# Patient Record
Sex: Female | Born: 1940 | Race: White | Hispanic: No | Marital: Married
Health system: Midwestern US, Community
[De-identification: ages and names within clinical notes are randomized; demographics above are authoritative.]

## PROBLEM LIST (undated history)

## (undated) DIAGNOSIS — F0390 Unspecified dementia without behavioral disturbance: Secondary | ICD-10-CM

## (undated) DIAGNOSIS — J343 Hypertrophy of nasal turbinates: Secondary | ICD-10-CM

## (undated) DIAGNOSIS — E785 Hyperlipidemia, unspecified: Secondary | ICD-10-CM

## (undated) DIAGNOSIS — M1990 Unspecified osteoarthritis, unspecified site: Secondary | ICD-10-CM

## (undated) HISTORY — PX: ABDOMINAL HYSTERECTOMY: SHX81

---

## 2011-04-26 NOTE — Op Note (Signed)
[facility]   Operation Report   NAME:  [name], [name]   SEX:   F   DATE: 04/26/2011   DOB: 10-24-1941   MR#    [id number]   ROOM:     ACCT#  [id number]       cc: Ocie Cornfield        2 copies to Dr. Lowell Guitar       Cc:  Ocie Cornfield, MD        [address]        [address], [place]  [address]       PREOPERATIVE DIAGNOSIS:   Two firm exophytic lesions anterior tip of tongue.     Lesion #1:  0.5 x 0.3 cm    Lesion #2:  0.4 x 0.3 cm.       POSTOPERATIVE DIAGNOSES:   Two firm exophytic lesions anterior tip of tongue.     Lesion #1:  0.5 x 0.3 cm    Lesion #2:  0.4 x 0.3 cm.   -- pathology pending.       PROCEDURE:   Excisional biopsy of 2 anterior tongue masses.       SURGEON:   Youlanda Roys, M.D.       ANESTHESIA:   General endotracheal anesthesia, Dr. Vassie Loll, supplemented with 1 mL of 0.5%    Marcaine with 1:200,000 epinephrine.       ESTIMATED BLOOD LOSS:   None.       DRAINS:   None.       COMPLICATIONS:   None.       INDICATIONS FOR SURGERY:   This is a pleasant 70 year old female who was referred to [facility] for a complaint of lesions on her tongue.     She was found to have 2 firm rounded lesions growing off the anterior tip of    the tongue.  One is off to the left of the midline and the other one is almost    at the midline.  They have been gradual in their growth and occurring in a    persistent pattern for 2 to 3 years.  The symptoms have been associated with    change in size and redness but no change in color, fever, pain or swelling.     Lesion is improved by nothing and it is exacerbated by nothing.  The patient's    dentist was concerned about these and he thinks it may be from biting her    tongue several times but he feels these should be biopsied and analyzed.         Physical exam confirms 2 exophytic mucosal covered lesions at the midline of    the tongue, one just off to the left of the midline and one at the midline.      We sat down with the patient, discussed options like doing them in the office    under local.  She did not want to do that; she preferred to have them done in    the hospital and be put to sleep briefly.  We have therefore scheduled her    for these excisional biopsies of these 2 tongue lesions as an outpatient under    general anesthesia.       NOTE #1:   Informed consent was obtained from the patient.       NOTE #2:   Preoperative vital signs:  Blood pressure 130/75,  pulse 59, respirations 18,    temperature 97.8, height 5 feet 7-1/2 inches tall, weight 72.8 kg.       NOTE #3:   Preoperative laboratory data:  Myocardial perfusion test done on 03/15/2011    was normal; EKG showed no acute changes.  Chest x-ray shows no scoliosis but    no acute findings.  On blood work, CBC was within normal limits.  Urinalysis    was also within normal limits, specific gravity 1.015, pH 7.0.       DESCRIPTION OF PROCEDURE:   The patient was brought to the operating room, placed in a supine position    after being identified in the holding area.  After satisfactory general    anesthesia was established via oral endotracheal intubation the patient was    given a gram of Kefzol intravenously and 10 mg of Decadron intravenously.  She    then went through the appropriate timeout period.  All parameters of the    timeout period were successfully passed according to the hospital procedure.     The patient then had a 2-0 silk suture placed through the midline of the    anterior tip of the tongue as a traction suture.  The tongue was then    protruded forward out of the mouth.  Each of the 2 lesions was identified.     Starting with the more lateral lesion on the left it was excised by taking    micro iridectomy scissors and cutting down deep into the substance of the    tongue and excising the lesion in total.  Any minor bleeding was controlled    with the needle tip cautery and then we did the same procedure on the right     side just to the right of midline on the other lesion.  Again minor bleeding    was controlled with the needle tip cautery.  The wounds were slightly    undermined and then they were closed in layers with 4-0 Monocryl using an    inverted suture to close the deep layers and invert them and then the    superficial epithelial layer was closed again with multiple interrupted 4-0    Vicryl sutures.  The wounds were then cleansed with sterile saline and dried.     No bleeding was noted.  No tongue swelling was noted and then taking a    27-gauge needle.  We took some Marcaine 0.5% with 1:200,000 epinephrine and    administered approximately 2 mL under the tongue into the anterior tip around    the lesions to provide some longterm anesthesia.  The patient is given some    postoperative instructions to have a soft diet for a week or 2, limit her    activity and no smoking and also to stay on a soft diet until she comes back    in the office in 2 weeks.  I have asked her to make an appointment to see me    in 2 weeks in the office for her checkup.           ___________________   Youlanda Roys MD   Dictated By:.    mad   D:04/26/2011   T: 04/26/2011 14:35:43   [id number]

## 2011-04-26 NOTE — Op Note (Signed)
[facility]   Operation Report   NAME:  [name] [name], [name] [name]   SEX:   F   DATE: 04/26/2011   DOB: 1941/11/19   MR#    [id number]   ROOM:     ACCT#  [id number]       cc: Ocie Cornfield        2 copies to Dr. Lowell Guitar       Cc:  Ocie Cornfield, MD        [address]        [address] [place]  [address]       PREOPERATIVE DIAGNOSIS:   Two firm exophytic lesions anterior tip of tongue.     Lesion #1:  0.5 x 0.3 cm    Lesion #2:  0.4 x 0.3 cm.       POSTOPERATIVE DIAGNOSES:   Two firm exophytic lesions anterior tip of tongue.     Lesion #1:  0.5 x 0.3 cm    Lesion #2:  0.4 x 0.3 cm.   -- pathology pending.       PROCEDURE:   Excisional biopsy of 2 anterior tongue masses.       SURGEON:   Youlanda Roys, M.D.       ANESTHESIA:   General endotracheal anesthesia, Dr. Vassie Loll, supplemented with 1 mL of 0.5%    Marcaine with 1:200,000 epinephrine.       ESTIMATED BLOOD LOSS:   None.       DRAINS:   None.       COMPLICATIONS:   None.       INDICATIONS FOR SURGERY:   This is a pleasant 70 year old female who was referred to [facility] for a complaint of lesions on her tongue.     She was found to have 2 firm rounded lesions growing off the anterior tip of    the tongue.  One is off to the left of the midline and the other one is almost    at the midline.  They have been gradual in their growth and occurring in a    persistent pattern for 2 to 3 years.  The symptoms have been associated with    change in size and redness but no change in color, fever, pain or swelling.     Lesion is improved by nothing and it is exacerbated by nothing.  The patient's    dentist was concerned about these and he thinks it may be from biting her    tongue several times but he feels these should be biopsied and analyzed.         Physical exam confirms 2 exophytic mucosal covered lesions at the midline of    the tongue, one just off to the left of the midline and one at the midline.     We sat down with the  patient, discussed options like doing them in the office    under local.  She did not want to do that; she preferred to have them done in    the hospital and be put to sleep briefly.  We have therefore scheduled her    for these excisional biopsies of these 2 tongue lesions as an outpatient under    general anesthesia.       NOTE #1:   Informed consent was obtained from the patient.       NOTE #2:   Preoperative vital signs:  Blood pressure 130/75,  pulse 59, respirations 18,    temperature 97.8, height 5 feet 7-1/2 inches tall, weight 72.8 kg.       NOTE #3:   Preoperative laboratory data:  Myocardial perfusion test done on 03/15/2011    was normal; EKG showed no acute changes.  Chest x-ray shows no scoliosis but    no acute findings.  On blood work, CBC was within normal limits.  Urinalysis    was also within normal limits, specific gravity 1.015, pH 7.0.       DESCRIPTION OF PROCEDURE:   The patient was brought to the operating room, placed in a supine position    after being identified in the holding area.  After satisfactory general    anesthesia was established via oral endotracheal intubation the patient was    given a gram of Kefzol intravenously and 10 mg of Decadron intravenously.  She    then went through the appropriate timeout period.  All parameters of the    timeout period were successfully passed according to the hospital procedure.     The patient then had a 2-0 silk suture placed through the midline of the    anterior tip of the tongue as a traction suture.  The tongue was then    protruded forward out of the mouth.  Each of the 2 lesions was identified.     Starting with the more lateral lesion on the left it was excised by taking    micro iridectomy scissors and cutting down deep into the substance of the    tongue and excising the lesion in total.  Any minor bleeding was controlled    with the needle tip cautery and then we did the same procedure on the right    side just to the right of midline  on the other lesion.  Again minor bleeding    was controlled with the needle tip cautery.  The wounds were slightly    undermined and then they were closed in layers with 4-0 Monocryl using an    inverted suture to close the deep layers and invert them and then the    superficial epithelial layer was closed again with multiple interrupted 4-0    Vicryl sutures.  The wounds were then cleansed with sterile saline and dried.     No bleeding was noted.  No tongue swelling was noted and then taking a    27-gauge needle.  We took some Marcaine 0.5% with 1:200,000 epinephrine and    administered approximately 2 mL under the tongue into the anterior tip around    the lesions to provide some longterm anesthesia.  The patient is given some    postoperative instructions to have a soft diet for a week or 2, limit her    activity and no smoking and also to stay on a soft diet until she comes back    in the office in 2 weeks.  I have asked her to make an appointment to see me    in 2 weeks in the office for her checkup.           ___________________   Youlanda Roys MD   Dictated By:.    mad   D:04/26/2011   T: 04/26/2011 14:35:43   [id number]

## 2016-07-23 ENCOUNTER — Emergency Department: Payer: Medicare Other

## 2016-07-23 ENCOUNTER — Emergency Department
Admission: EM | Admit: 2016-07-23 | Discharge: 2016-07-23 | Disposition: A | Discharge status: Home / Self Care | Payer: Medicare Other | Attending: Emergency Medicine | Admitting: Emergency Medicine

## 2016-07-23 DIAGNOSIS — Y999 Unspecified external cause status: Secondary | ICD-10-CM | POA: Insufficient documentation

## 2016-07-23 DIAGNOSIS — Y939 Activity, unspecified: Secondary | ICD-10-CM | POA: Insufficient documentation

## 2016-07-23 DIAGNOSIS — Y929 Unspecified place or not applicable: Secondary | ICD-10-CM | POA: Insufficient documentation

## 2016-07-23 DIAGNOSIS — W19XXXA Unspecified fall, initial encounter: Secondary | ICD-10-CM | POA: Diagnosis not present

## 2016-07-23 DIAGNOSIS — S82001A Unspecified fracture of right patella, initial encounter for closed fracture: Secondary | ICD-10-CM | POA: Diagnosis not present

## 2016-07-23 DIAGNOSIS — M25561 Pain in right knee: Secondary | ICD-10-CM | POA: Diagnosis present

## 2016-07-23 NOTE — Discharge Instructions (Signed)
Call the orthopedic doctor listed above. Weight-bearing as tolerated. Ice and elevation when not using.

## 2016-07-23 NOTE — ED Triage Notes (Signed)
Pt arrives to ER via POV c/o right knee pain and right elbow abrasion that occurred after mechanical fall last night. No injury to head. No LOC. Pt alert and oriented X4, active, cooperative, pt in NAD. RR even and unlabored, color WNL.  Pain worse with movement, pt able to ambulate with full ROM.

## 2016-07-23 NOTE — ED Provider Notes (Signed)
[facility] Emergency Department Provider Note  ____________________________________________  Time seen: Approximately 11:48 AM  I have reviewed the triage vital signs and the nursing notes.   HISTORY  Chief Complaint Fall and Knee Pain (right knee)    HPI Allison Ramos is a 75 y.o. female presents for evaluation of right knee and right elbow abrasion after falling last night. Patient denies any head injury or loss of consciousness. She was able to ambulate with full range of motion. She describes her pain and level of discomfort is an 8/10 at this time.   History reviewed. No pertinent past medical history.  There are no active problems to display for this patient.   History reviewed. No pertinent surgical history.  Prior to Admission medications   Not on File    Allergies Darvon [propoxyphene]  No family history on file.  Social History Social History  Substance Use Topics  . Smoking status: Never Smoker  . Smokeless tobacco: Not on file  . Alcohol use 0.6 oz/week    1 Glasses of wine per week    Review of Systems Constitutional: No fever/chills Eyes: No visual changes. ENT: No sore throat. Cardiovascular: Denies chest pain. Respiratory: Denies shortness of breath. Gastrointestinal: No abdominal pain.  No nausea, no vomiting.  No diarrhea.  No constipation. Genitourinary: Negative for dysuria. Musculoskeletal: Right knee with point tenderness and swelling noted. Positive for ecchymosis Skin: Negative for rash. Neurological: Negative for headaches, focal weakness or numbness.  10-point ROS otherwise negative.  ____________________________________________   PHYSICAL EXAM:  VITAL SIGNS: ED Triage Vitals  Enc Vitals Group     BP 07/23/16 1114 (!) 142/59     Pulse Rate 07/23/16 1114 63     Resp 07/23/16 1114 20     Temp 07/23/16 1114 98.2 F (36.8 C)     Temp Source 07/23/16 1114 Oral     SpO2 07/23/16 1114 96 %   Weight --      Height --      Head Circumference --      Peak Flow --      Pain Score 07/23/16 1115 8     Pain Loc --      Pain Edu? --      Excl. in [place]? --     Constitutional: Alert and oriented. Well appearing and in no acute distress.  Cardiovascular: Normal rate, regular rhythm. Grossly normal heart sounds.  Good peripheral circulation. Respiratory: Normal respiratory effort.  No retractions. Lungs CTAB. Musculoskeletal: Right knee with positive edema and ecchymosis and tenderness. Distally neurovascularly intact. Neurologic:  Normal speech and language. No gross focal neurologic deficits are appreciated. No gait instability. Skin:  Skin is warm, dry and intact. No rash noted. Psychiatric: Mood and affect are normal. Speech and behavior are normal.  ____________________________________________   LABS (all labs ordered are listed, but only abnormal results are displayed)  Labs Reviewed - No data to display ____________________________________________  EKG   ____________________________________________  RADIOLOGY  IMPRESSION: Nondisplaced fracture through the inferior pole of the right patella. ____________________________________________   PROCEDURES  Procedure(s) performed: None  Critical Care performed: No  ____________________________________________   INITIAL IMPRESSION / ASSESSMENT AND PLAN / ED COURSE  Pertinent labs & imaging results that were available during my care of the patient were reviewed by me and considered in my medical decision making (see chart for details).  Nondisplaced right patellar fracture. Patient was placed in knee immobilizer and referral given to orthopedics on call who reviewed  the films. Patient to follow-up with his office later on this week. Rx not provided due to patient request.  Clinical Course    ____________________________________________   FINAL CLINICAL IMPRESSION(S) / ED DIAGNOSES  Final diagnoses:  Fall,  initial encounter  Patella fracture, right, closed, initial encounter     This chart was dictated using voice recognition software/Dragon. Despite best efforts to proofread, errors can occur which can change the meaning. Any change was purely unintentional.    Arlyss Repress, PA-C 07/23/16 1326    Delman Kitten, MD 07/23/16 [phone]

## 2017-03-25 ENCOUNTER — Inpatient Hospital Stay: Admit: 2017-03-25 | Payer: MEDICARE

## 2017-03-25 DIAGNOSIS — Z01818 Encounter for other preprocedural examination: Secondary | ICD-10-CM

## 2017-03-25 LAB — EKG 12-LEAD
Atrial Rate: 61 {beats}/min
Diagnosis: NORMAL
P Axis: 70 degrees
P-R Interval: 196 ms
Q-T Interval: 444 ms
QRS Duration: 138 ms
QTc Calculation (Bazett): 446 ms
R Axis: -61 degrees
T Axis: 100 degrees
Ventricular Rate: 61 {beats}/min

## 2017-03-25 LAB — URINALYSIS W/ RFLX MICROSCOPIC
Bilirubin: NEGATIVE
Blood: NEGATIVE
Glucose: NEGATIVE mg/dl
Ketone: NEGATIVE mg/dl
Leukocyte Esterase: NEGATIVE
Nitrites: NEGATIVE
Protein: NEGATIVE mg/dl
Specific gravity: <=1.005 (ref 1.005–1.030)
Urobilinogen: 0.2 mg/dl (ref 0.0–1.0)
pH (UA): 7 (ref 5.0–9.0)

## 2017-03-25 LAB — CBC WITH AUTOMATED DIFF
BASOPHILS: 0.7 % (ref 0–3)
EOSINOPHILS: 1.6 % (ref 0–5)
HCT: 40.7 % (ref 37.0–50.0)
HGB: 14 gm/dl (ref 13.0–17.2)
IMMATURE GRANULOCYTES: 0.2 % (ref 0.0–3.0)
LYMPHOCYTES: 37.2 % (ref 28–48)
MCH: 30.2 pg (ref 25.4–34.6)
MCHC: 34.4 gm/dl (ref 30.0–36.0)
MCV: 87.9 fL (ref 80.0–98.0)
MONOCYTES: 8.6 % (ref 1–13)
MPV: 10.6 fL — ABNORMAL HIGH (ref 6.0–10.0)
NEUTROPHILS: 51.7 % (ref 34–64)
NRBC: 0 (ref 0–0)
PLATELET: 162 10*3/uL (ref 140–450)
RBC: 4.63 M/uL (ref 3.60–5.20)
RDW-SD: 41.8 (ref 36.4–46.3)
WBC: 5.6 10*3/uL (ref 4.0–11.0)

## 2017-03-25 LAB — PROTHROMBIN TIME + INR
INR: 1 (ref 0.1–1.1)
Prothrombin time: 11 seconds (ref 10.2–12.9)

## 2017-03-25 LAB — METABOLIC PANEL, BASIC
BUN: 7 mg/dl (ref 7–25)
CO2: 32 mEq/L (ref 21–32)
Calcium: 9.2 mg/dl (ref 8.5–10.1)
Chloride: 104 mEq/L (ref 98–107)
Creatinine: 0.4 mg/dl — ABNORMAL LOW (ref 0.6–1.3)
GFR est AA: >60
GFR est non-AA: >60
Glucose: 81 mg/dl (ref 74–106)
Potassium: 3.7 mEq/L (ref 3.5–5.1)
Sodium: 140 mEq/L (ref 136–145)

## 2017-03-25 LAB — EKG, 12 LEAD, INITIAL
Atrial Rate: 61 {beats}/min
Calculated P Axis: 70 degrees
Calculated R Axis: -61 degrees
Calculated T Axis: 100 degrees
Diagnosis: NORMAL
P-R Interval: 196 ms
Q-T Interval: 444 ms
QRS Duration: 138 ms
QTC Calculation (Bezet): 446 ms
Ventricular Rate: 61 {beats}/min

## 2017-03-25 LAB — PTT: aPTT: 29 seconds (ref 25.1–36.5)

## 2017-04-22 ENCOUNTER — Encounter

## 2017-04-22 ENCOUNTER — Inpatient Hospital Stay: Payer: MEDICARE

## 2017-04-22 MED ORDER — ONDANSETRON (PF) 4 MG/2 ML INJECTION
4 mg/2 mL | INTRAMUSCULAR | Status: DC | PRN
Start: 2017-04-22 — End: 2017-04-22
  Administered 2017-04-22: 14:00:00 via INTRAVENOUS

## 2017-04-22 MED ORDER — ROCURONIUM 10 MG/ML IV
10 mg/mL | INTRAVENOUS | Status: DC | PRN
Start: 2017-04-22 — End: 2017-04-22
  Administered 2017-04-22: 13:00:00 via INTRAVENOUS

## 2017-04-22 MED ORDER — LIDOCAINE-EPINEPHRINE BITARTRATE 2 %-1:100,000 INJECTION, CARTRIDGE
2 %-1:100,000 | INTRAMUSCULAR | Status: DC | PRN
Start: 2017-04-22 — End: 2017-04-22
  Administered 2017-04-22: 14:00:00

## 2017-04-22 MED ORDER — FLUMAZENIL 0.1 MG/ML IV SOLN
0.1 mg/mL | INTRAVENOUS | Status: DC | PRN
Start: 2017-04-22 — End: 2017-04-22

## 2017-04-22 MED ORDER — FENTANYL CITRATE (PF) 50 MCG/ML IJ SOLN
50 mcg/mL | INTRAMUSCULAR | Status: AC | PRN
Start: 2017-04-22 — End: 2017-04-22
  Administered 2017-04-22 (×4): via INTRAVENOUS

## 2017-04-22 MED ORDER — HYDROMORPHONE 2 MG TAB
2 mg | Freq: Once | ORAL | Status: AC | PRN
Start: 2017-04-22 — End: 2017-04-22
  Administered 2017-04-22: 17:00:00 via ORAL

## 2017-04-22 MED ORDER — OXYMETAZOLINE 0.05 % NASAL SPRAY AEROSOL
0.05 % | NASAL | Status: DC | PRN
Start: 2017-04-22 — End: 2017-04-22
  Administered 2017-04-22: 14:00:00

## 2017-04-22 MED ORDER — LACTATED RINGERS IV
INTRAVENOUS | Status: DC
Start: 2017-04-22 — End: 2017-04-22
  Administered 2017-04-22: 13:00:00 via INTRAVENOUS

## 2017-04-22 MED ORDER — MIDAZOLAM 1 MG/ML IJ SOLN
1 mg/mL | INTRAMUSCULAR | Status: DC | PRN
Start: 2017-04-22 — End: 2017-04-22
  Administered 2017-04-22: 13:00:00 via INTRAVENOUS

## 2017-04-22 MED ORDER — MUPIROCIN 2 % OINTMENT
2 % | CUTANEOUS | Status: AC
Start: 2017-04-22 — End: ?

## 2017-04-22 MED ORDER — LABETALOL 5 MG/ML IV SOLN
5 mg/mL | INTRAVENOUS | Status: DC | PRN
Start: 2017-04-22 — End: 2017-04-22

## 2017-04-22 MED ORDER — NALOXONE 0.4 MG/ML INJECTION
0.4 mg/mL | INTRAMUSCULAR | Status: DC | PRN
Start: 2017-04-22 — End: 2017-04-22

## 2017-04-22 MED ORDER — LIDOCAINE (PF) 20 MG/ML (2 %) IJ SOLN
20 mg/mL (2 %) | INTRAMUSCULAR | Status: DC | PRN
Start: 2017-04-22 — End: 2017-04-22
  Administered 2017-04-22: 13:00:00 via INTRAVENOUS

## 2017-04-22 MED ORDER — MUPIROCIN 2 % OINTMENT
2 % | CUTANEOUS | Status: DC | PRN
Start: 2017-04-22 — End: 2017-04-22
  Administered 2017-04-22: 14:00:00 via TOPICAL

## 2017-04-22 MED ORDER — ONDANSETRON (PF) 4 MG/2 ML INJECTION
4 mg/2 mL | Freq: Once | INTRAMUSCULAR | Status: AC | PRN
Start: 2017-04-22 — End: 2017-04-22
  Administered 2017-04-22: 17:00:00 via INTRAVENOUS

## 2017-04-22 MED ORDER — LIDOCAINE (PF) 20 MG/ML (2 %) IV SYRINGE
100 mg/5 mL (2 %) | INTRAVENOUS | Status: AC
Start: 2017-04-22 — End: ?

## 2017-04-22 MED ORDER — OXYMETAZOLINE 0.05 % NASAL SPRAY AEROSOL
0.05 % | NASAL | Status: AC
Start: 2017-04-22 — End: ?

## 2017-04-22 MED ORDER — FENTANYL CITRATE (PF) 50 MCG/ML IJ SOLN
50 mcg/mL | INTRAMUSCULAR | Status: AC
Start: 2017-04-22 — End: ?

## 2017-04-22 MED ORDER — PROPOFOL 10 MG/ML IV EMUL
10 mg/mL | INTRAVENOUS | Status: DC | PRN
Start: 2017-04-22 — End: 2017-04-22
  Administered 2017-04-22: 13:00:00 via INTRAVENOUS

## 2017-04-22 MED ORDER — SUGAMMADEX 100 MG/ML INTRAVENOUS SOLUTION
100 mg/mL | INTRAVENOUS | Status: AC
Start: 2017-04-22 — End: ?

## 2017-04-22 MED ORDER — DEXAMETHASONE SODIUM PHOSPHATE 4 MG/ML IJ SOLN
4 mg/mL | INTRAMUSCULAR | Status: DC | PRN
Start: 2017-04-22 — End: 2017-04-22
  Administered 2017-04-22: 13:00:00 via INTRAVENOUS

## 2017-04-22 MED ORDER — LIDOCAINE HCL 1 % (10 MG/ML) IJ SOLN
10 mg/mL (1 %) | Freq: Once | INTRAMUSCULAR | Status: DC | PRN
Start: 2017-04-22 — End: 2017-04-22

## 2017-04-22 MED ORDER — MIDAZOLAM 1 MG/ML IJ SOLN
1 mg/mL | INTRAMUSCULAR | Status: AC
Start: 2017-04-22 — End: ?

## 2017-04-22 MED ORDER — ROCURONIUM 10 MG/ML IV
10 mg/mL | INTRAVENOUS | Status: AC
Start: 2017-04-22 — End: ?

## 2017-04-22 MED ORDER — LIDOCAINE-EPINEPHRINE BITARTRATE 2 %-1:100,000 INJECTION, CARTRIDGE
2 %-1:100,000 | INTRAMUSCULAR | Status: AC
Start: 2017-04-22 — End: ?

## 2017-04-22 MED ORDER — FENTANYL CITRATE (PF) 50 MCG/ML IJ SOLN
50 mcg/mL | INTRAMUSCULAR | Status: DC | PRN
Start: 2017-04-22 — End: 2017-04-22
  Administered 2017-04-22: 13:00:00 via INTRAVENOUS

## 2017-04-22 MED ORDER — HYDRALAZINE 20 MG/ML IJ SOLN
20 mg/mL | INTRAMUSCULAR | Status: DC | PRN
Start: 2017-04-22 — End: 2017-04-22

## 2017-04-22 MED ORDER — SODIUM CHLORIDE 0.9 % IJ SYRG
INTRAMUSCULAR | Status: DC | PRN
Start: 2017-04-22 — End: 2017-04-22

## 2017-04-22 MED ORDER — PROPOFOL 10 MG/ML IV EMUL
10 mg/mL | INTRAVENOUS | Status: AC
Start: 2017-04-22 — End: ?

## 2017-04-22 MED ORDER — CEFAZOLIN 2 G IN 100 ML 0.9% NS
2 gram/100 mL | INTRAVENOUS | Status: DC | PRN
Start: 2017-04-22 — End: 2017-04-22
  Administered 2017-04-22: 13:00:00 via INTRAVENOUS

## 2017-04-22 MED ORDER — ONDANSETRON (PF) 4 MG/2 ML INJECTION
4 mg/2 mL | INTRAMUSCULAR | Status: AC
Start: 2017-04-22 — End: ?

## 2017-04-22 MED FILL — MUPIROCIN 2 % OINTMENT: 2 % | CUTANEOUS | Qty: 22

## 2017-04-22 MED FILL — FENTANYL CITRATE (PF) 50 MCG/ML IJ SOLN: 50 mcg/mL | INTRAMUSCULAR | Qty: 2

## 2017-04-22 MED FILL — LIDOCAINE (PF) 20 MG/ML (2 %) IV SYRINGE: 100 mg/5 mL (2 %) | INTRAVENOUS | Qty: 5

## 2017-04-22 MED FILL — NASAL DECONGESTANT (OXYMETAZOLINE) 0.05 % SPRAY: 0.05 % | NASAL | Qty: 2

## 2017-04-22 MED FILL — BRIDION 100 MG/ML INTRAVENOUS SOLUTION: 100 mg/mL | INTRAVENOUS | Qty: 2

## 2017-04-22 MED FILL — MIDAZOLAM 1 MG/ML IJ SOLN: 1 mg/mL | INTRAMUSCULAR | Qty: 2

## 2017-04-22 MED FILL — ONDANSETRON (PF) 4 MG/2 ML INJECTION: 4 mg/2 mL | INTRAMUSCULAR | Qty: 2

## 2017-04-22 MED FILL — ROCURONIUM 10 MG/ML IV: 10 mg/mL | INTRAVENOUS | Qty: 5

## 2017-04-22 MED FILL — HYDROMORPHONE 2 MG TAB: 2 mg | ORAL | Qty: 1

## 2017-04-22 MED FILL — LIDOCAINE-EPINEPHRINE BITARTRATE 2 %-1:100,000 INJECTION, CARTRIDGE: 2 %-1:100,000 | INTRAMUSCULAR | Qty: 1.7

## 2017-04-22 MED FILL — LACTATED RINGERS IV: INTRAVENOUS | Qty: 1000

## 2017-04-22 MED FILL — DIPRIVAN 10 MG/ML INTRAVENOUS EMULSION: 10 mg/mL | INTRAVENOUS | Qty: 20

## 2017-04-22 NOTE — Brief Op Note (Signed)
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  Estimated Blood Loss: MINIMAL  Specimens:   ID Type Source Tests Collected by Time Destination   1 : nasal bone and cartilege Tissue Nose AP HISTOLOGY Sandi Carne, MD 04/22/2017 0945       Findings: SEE FULL OPERATIVE REPORT   Complications: NONE  Implants: * No implants in log *

## 2017-04-22 NOTE — Other (Signed)
TRANSFER - OUT REPORT:    Verbal report given to [facility] MANN RN(name) on Sabrie H Burmester  being transferred to PHASE 2(unit) for routine post - op       Report consisted of patient???s Situation, Background, Assessment and   Recommendations(SBAR).     Information from the following report(s) OR Summary, Intake/Output and MAR was reviewed with the receiving nurse.    Lines:   Peripheral IV 04/22/17 Left Forearm (Active)   Site Assessment Clean, dry, & intact 04/22/2017 10:39 AM   Phlebitis Assessment 0 04/22/2017 10:39 AM   Infiltration Assessment 0 04/22/2017 10:39 AM   Dressing Status Clean, dry, & intact 04/22/2017 10:39 AM   Dressing Type Transparent 04/22/2017 10:39 AM   Hub Color/Line Status Pink 04/22/2017 10:39 AM        Opportunity for questions and clarification was provided.      Patient transported with:   [employer]

## 2017-04-22 NOTE — Op Note (Signed)
[facility]  Operation Report  NAME:  [name], [name]  SEX:   F  DATE: 04/22/2017  DOB: Sep 16, 1941  MR#    [id number]  ROOM:  PL  ACCT#  [id number]    cc: Arnette Schaumann MD    COPY TO:   Arnette Schaumann, M.D.  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    PROCEDURE:  Nasal septal reconstruction utilizing inferior turbinate mucosal grafts to left posterior septum and bilateral partial resection of inferior turbinates.    SURGEON:  Youlanda Roys, M.D.    SURGICAL ASSISTANT:  Chauncy Lean, CSA    ANESTHESIA:  General endotracheal anesthesia, Dr. Solon Augusta, supplemented with topical Afrin on cottonoids and 2% lidocaine with 1:100,000 epinephrine (8 mL).    ESTIMATED BLOOD LOSS:  Minimal.    PACKING:  Bilateral Merogel, bilateral Doyle splints, bilateral Bactroban ointment, bilateral Nu-Knit Surgicel, bilateral Rapid Rhino packs.    COMPLICATIONS:  None.    INDICATIONS FOR SURGERY:  This is a pleasant 76 year old female who was referred to [facility] for complaint of trouble breathing through her nose.  This has been going on for 1 to 2 years.  It is constant.  It was associated with nasal trauma.  She says she broke her nose about 1-1/2 years ago when she fell flat on her face.  She complains of difficulty breathing through her nose, left greater than right, and she says the nose is somewhat deformed on the left side.  She complains of nasal obstruction that is bilateral.  Because she has bilateral nasal obstruction she is having trouble utilizing any type of CPAP mask.    Physical examination shows the nasal septum to be deviated to the left  with marked obstruction.  The right side is also narrowed by a marked hypertrophy of the right greater than left inferior turbinate.  We discussed the findings with her and went over the video endoscopy pictures with her.  She would like to have this corrected since it is causing her significant trouble and we have gone ahead and scheduled her for corrective nasal airway surgery in the form of a nasal septoplasty and inferior turbinate reduction.  She is comfortable with that and would like to proceed.    NOTE #1:  Informed consent was obtained from the patient.  We sat down and went over in detail the procedure for septoplasty and turbinate reduction.  I gave her a booklet about nasal surgeries and reviewed that with her in detail.  We went over alternative treatment methods such as allergy nose sprays and medications such as Claritin, Benadryl, Zyrtec, etc., and also no therapy.  She would like to proceed with the surgery.  She feels it is a major problem.  She has signed all the appropriate informed consents witnessed by the office secretarial staff and we made it clear that no guarantees have been made as to surgical outcome.  We also reviewed with her the representative risks and complications such as bleeding, infection, nasal septal abscess, nasal septal perforation, intranasal scarring, lack of appropriate healing, need for revision surgery, etc.  She states she understands all of this also and would like to proceed.    PROCEDURE IN DETAIL:  The patient  was greeted in the holding area with her husband and after appropriate discussion, we took her back to operating room 2, placed her in a supine position on the operating table.  After satisfactory general anesthesia was established via oral endotracheal intubation, the patient was given 2 grams of Kefzol intravenously and 10 mg of Decadron intravenously.  She then had the vibrissae of her nose clipped with iris  scissors.  The nose then lightly packed with topical Afrin on cottonoids.  After satisfactory initial mucosal vasoconstriction, the cottonoids were removed and the nasal septum and inferior turbinates were infiltrated with a total of 8 mL of 2% lidocaine with 1:100,000 epinephrine.  The patient was then prepped and draped in a sterile manner for intranasal surgery.  Surgery was begun by making an incision on the caudal edge of the nasal septum in the left nasal vestibule with a Beaver 64 blade.  Bilateral mucoperichondrial flaps were developed with Glorious Peach and Health and safety inspector.  The septum was isolated with a caudal speculum.  The mucoperichondrial flap on the left was exceedingly thin and markedly adherent to the bony septum and its periosteum.  We elevated the flaps up and incised the cartilaginous septum off the maxillary crest.  It was shifted to the right side.  The vomerine plate was deviated to the left with a large bony cartilaginous spur.  This was all taken down under careful direct vision in a piecemeal fashion with Lenoria Chime forceps.  Several pieces of vomerine plate were trimmed and morcellized and reinserted back into the posterior mucoperichondrial for soft tissue support.  A pocket was created in the columella of the nose with skin hooks and curved Fomon cartilage scissors and the caudal septum was advanced into this new columellar pocket to give good tip support and open up the nasal valve area bilaterally.  We noticed that the mucoperiosteum on the left posterior septum was extremely atrophic and almost tissue paper thin.  I was concerned that she may perforate her septum postop.  We therefore infractured both inferior turbinates, crossclamped the inferior half of each inferior turbinate with a straight vascular clamp and then resected the inferior half of each inferior turbinate.  We harvested the turbinate mucosa and after flaying it carefully, implanted it in the left  side of the septum to bolster the mucoperichondrial flap.  Once this was done and the grafts were in place nicely, we then placed a piece of Merogel on each side of the nasal septum followed by application of Bactroban-coated Doyle splints.  The splints were secured anteriorly and posteriorly with horizontal mattress suture of 2-0 nylon.  Then, the inferior turbinate resection line was cauterized with suction cautery and overlaid with a single piece of Surgicel on each side.  The nose was suctioned clear of any blood or debris and bilateral Rapid Rhino packs were placed with sterile cotton in each nasal vestibule.  This completed the procedure on the patient.  She was then returned to anesthesia where she was awakened from her anesthetic, extubated and taken to the recovery room in excellent condition.  Blood loss on the case was minimal.  There were no complications from anesthetic or surgery, tolerated both quite well.    The husband was not in the recovery room and did not answer his page.  We will talk to him when he returns and will have the patient back on Wednesday 04/24/2017 at 8:45 in the morning to have her first postoperative checkup and nasal packs  removed.  We also are issuing her a typewritten sheet of postoperative nasal instructions along with 3 prescriptions:  Augmentin 875 mg b.i.d. for 10 days, since she is ALLERGIC TO DARVOCET Dilaudid 2 mg tablets 1 to 2 every 4 to 6 hours as needed for postop pain and Phenergan 25 mg tablet 1 q.6 hours p.r.n. postop nausea.  She has my home phone, cell phone and office phone should she have any questions or problems she can contact me immediately and she will follow up with [place] at 8:45 on Wednesday 04/24/2017 for the first postoperative checkup.      ___________________  Youlanda Roys MD  Dictated By:.   JMB  D:04/22/2017 11:05:43  T: 04/22/2017 13:43:24  [id number]

## 2017-04-22 NOTE — Other (Signed)
CPAP sent home with patient

## 2017-04-22 NOTE — Anesthesia Pre-Procedure Evaluation (Signed)
Anesthetic History   No history of anesthetic complications            Review of Systems / Medical History  Patient summary reviewed, nursing notes reviewed and pertinent labs reviewed    Pulmonary        Sleep apnea           Neuro/Psych         Headaches (migraine)    Comments: LE numbness  Balance issues Cardiovascular              Hyperlipidemia    Exercise tolerance: >4 METS     GI/Hepatic/Renal     GERD    Renal disease: stones       Endo/Other      Hypothyroidism  Arthritis     Other Findings              Physical Exam    Airway  Mallampati: II  TM Distance: 4 - 6 cm  Neck ROM: decreased range of motion   Mouth opening: Normal     Cardiovascular  Regular rate and rhythm,  S1 and S2 normal,  no murmur, click, rub, or gallop             Dental  No notable dental hx       Pulmonary  Breath sounds clear to auscultation               Abdominal  GI exam deferred       Other Findings            Anesthetic Plan    ASA: 3  Anesthesia type: general            Anesthetic plan and risks discussed with: Patient

## 2017-04-22 NOTE — Other (Addendum)
Went over [employer] instructions with patient and husband, copy given to husband 3 prescriptions given to husband

## 2017-04-22 NOTE — Anesthesia Post-Procedure Evaluation (Signed)
Post-Anesthesia Evaluation and Assessment    Patient: CECYLIA BRAZILL MRN: [id number]  SSN: [id number]    Date of Birth: 02-Feb-1941  Age: 76 y.o.  Sex: female       Cardiovascular Function/Vital Signs  Visit Vitals   ??? BP 126/78   ??? Pulse 77   ??? Temp 36.1 ??C (97 ??F)   ??? Resp 16   ??? Ht  (1.727 m)   ??? Wt 75.6 kg (166 lb 10.7 oz)   ??? SpO2 100%   ??? BMI 25.34 kg/m2       Patient is status post general anesthesia for Procedure(s):  SEPTOPLASTY; BILATERAL PARTIAL RESECTION OF INFERIOR TURBINATE .    Nausea/Vomiting: None    Postoperative hydration reviewed and adequate.    Pain:  Pain Scale 1: Numeric (0 - 10) (04/22/17 1039)  Pain Intensity 1: 4 (04/22/17 1039)   Managed    Neurological Status:   Neuro  Neurologic State: Eyes open to voice (04/22/17 1039)  Orientation Level: Oriented to situation;Oriented to place (04/22/17 1039)  Cognition: Follows commands (04/22/17 1039)  Speech: Clear (04/22/17 1039)   At baseline    Mental Status and Level of Consciousness: Arousable    Pulmonary Status:   O2 Device: Room air (04/22/17 1043)   Adequate oxygenation and airway patent    Complications related to anesthesia: None    Post-anesthesia assessment completed. No concerns    Signed By: Charlyne Mom., MD     April 22, 2017

## 2017-04-22 NOTE — H&P (Signed)
History and Physical completed on paper.  See under media tab.    H&P Update:  [name] [name] was seen and examined.  History and physical has been reviewed. The patient has been examined. There have been no significant clinical changes since the completion of the originally dated History and Physical.    Signed By: Sandi Carne, MD     April 22, 2017 10:45 AM

## 2017-04-22 NOTE — Op Note (Signed)
[facility]  Operation Report  NAME:  [name] [name], [name] [name]  SEX:   F  DATE: 04/22/2017  DOB: January 31, 1941  MR#    [id number]  ROOM:  PL  ACCT#  [id number]    cc: Arnette Schaumann MD    COPY TO:   Arnette Schaumann, M.D.  [address]  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    PROCEDURE:  Nasal septal reconstruction utilizing inferior turbinate mucosal grafts to left posterior septum and bilateral partial resection of inferior turbinates.    SURGEON:  Youlanda Roys, M.D.    SURGICAL ASSISTANT:  Chauncy Lean, CSA    ANESTHESIA:  General endotracheal anesthesia, Dr. Solon Augusta, supplemented with topical Afrin on cottonoids and 2% lidocaine with 1:100,000 epinephrine (8 mL).    ESTIMATED BLOOD LOSS:  Minimal.    PACKING:  Bilateral Merogel, bilateral Doyle splints, bilateral Bactroban ointment, bilateral Nu-Knit Surgicel, bilateral Rapid Rhino packs.    COMPLICATIONS:  None.    INDICATIONS FOR SURGERY:  This is a pleasant 76 year old female who was referred to [facility] for complaint of trouble breathing through her nose.  This has been going on for 1 to 2 years.  It is constant.  It was associated with nasal trauma.  She says she broke her nose about 1-1/2 years ago when she fell flat on her face.  She complains of difficulty breathing through her nose, left greater than right, and she says the nose is somewhat deformed on the left side.  She complains of nasal obstruction that is bilateral.  Because she has bilateral nasal obstruction she is having trouble utilizing any type of CPAP mask.    Physical examination shows the nasal septum to be deviated to the left with marked obstruction.  The right  side is also narrowed by a marked hypertrophy of the right greater than left inferior turbinate.  We discussed the findings with her and went over the video endoscopy pictures with her.  She would like to have this corrected since it is causing her significant trouble and we have gone ahead and scheduled her for corrective nasal airway surgery in the form of a nasal septoplasty and inferior turbinate reduction.  She is comfortable with that and would like to proceed.    NOTE #1:  Informed consent was obtained from the patient.  We sat down and went over in detail the procedure for septoplasty and turbinate reduction.  I gave her a booklet about nasal surgeries and reviewed that with her in detail.  We went over alternative treatment methods such as allergy nose sprays and medications such as Claritin, Benadryl, Zyrtec, etc., and also no therapy.  She would like to proceed with the surgery.  She feels it is a major problem.  She has signed all the appropriate informed consents witnessed by the office secretarial staff and we made it clear that no guarantees have been made as to surgical outcome.  We also reviewed with her the representative risks and complications such as bleeding, infection, nasal septal abscess, nasal septal perforation, intranasal scarring, lack of appropriate healing, need for revision surgery, etc.  She states she understands all of this also and would like to proceed.    PROCEDURE IN DETAIL:  The patient  was greeted in the holding area with her husband and after appropriate discussion, we took her back to operating room 2, placed her in a supine position on the operating table.  After satisfactory general anesthesia was established via oral endotracheal intubation, the patient was given 2 grams of Kefzol intravenously and 10 mg of Decadron intravenously.  She then had the vibrissae of her nose clipped with iris scissors.  The nose then lightly packed with topical Afrin on cottonoids.  After  satisfactory initial mucosal vasoconstriction, the cottonoids were removed and the nasal septum and inferior turbinates were infiltrated with a total of 8 mL of 2% lidocaine with 1:100,000 epinephrine.  The patient was then prepped and draped in a sterile manner for intranasal surgery.  Surgery was begun by making an incision on the caudal edge of the nasal septum in the left nasal vestibule with a Beaver 64 blade.  Bilateral mucoperichondrial flaps were developed with Glorious Peach and Health and safety inspector.  The septum was isolated with a caudal speculum.  The mucoperichondrial flap on the left was exceedingly thin and markedly adherent to the bony septum and its periosteum.  We elevated the flaps up and incised the cartilaginous septum off the maxillary crest.  It was shifted to the right side.  The vomerine plate was deviated to the left with a large bony cartilaginous spur.  This was all taken down under careful direct vision in a piecemeal fashion with Lenoria Chime forceps.  Several pieces of vomerine plate were trimmed and morcellized and reinserted back into the posterior mucoperichondrial for soft tissue support.  A pocket was created in the columella of the nose with skin hooks and curved Fomon cartilage scissors and the caudal septum was advanced into this new columellar pocket to give good tip support and open up the nasal valve area bilaterally.  We noticed that the mucoperiosteum on the left posterior septum was extremely atrophic and almost tissue paper thin.  I was concerned that she may perforate her septum postop.  We therefore infractured both inferior turbinates, crossclamped the inferior half of each inferior turbinate with a straight vascular clamp and then resected the inferior half of each inferior turbinate.  We harvested the turbinate mucosa and after flaying it carefully, implanted it in the left side of the septum to bolster the mucoperichondrial flap.  Once this was done and the  grafts were in place nicely, we then placed a piece of Merogel on each side of the nasal septum followed by application of Bactroban-coated Doyle splints.  The splints were secured anteriorly and posteriorly with horizontal mattress suture of 2-0 nylon.  Then, the inferior turbinate resection line was cauterized with suction cautery and overlaid with a single piece of Surgicel on each side.  The nose was suctioned clear of any blood or debris and bilateral Rapid Rhino packs were placed with sterile cotton in each nasal vestibule.  This completed the procedure on the patient.  She was then returned to anesthesia where she was awakened from her anesthetic, extubated and taken to the recovery room in excellent condition.  Blood loss on the case was minimal.  There were no complications from anesthetic or surgery, tolerated both quite well.    The husband was not in the recovery room and did not answer his page.  We will talk to him when he returns and will have the patient back on Wednesday 04/24/2017 at 8:45 in the morning to have her first postoperative checkup and nasal packs  removed.  We also are issuing her a typewritten sheet of postoperative nasal instructions along with 3 prescriptions:  Augmentin 875 mg b.i.d. for 10 days, since she is ALLERGIC TO DARVOCET Dilaudid 2 mg tablets 1 to 2 every 4 to 6 hours as needed for postop pain and Phenergan 25 mg tablet 1 q.6 hours p.r.n. postop nausea.  She has my home phone, cell phone and office phone should she have any questions or problems she can contact me immediately and she will follow up with [place] at 8:45 on Wednesday 04/24/2017 for the first postoperative checkup.      ___________________  Youlanda Roys MD  Dictated By:.   JMB  D:04/22/2017 11:05:43  T: 04/22/2017 13:43:24  [id number]

## 2017-04-25 MED FILL — PROPOFOL 10 MG/ML IV EMUL: 10 mg/mL | INTRAVENOUS | Qty: 150

## 2017-04-25 MED FILL — CEFAZOLIN 2 G IN 100 ML 0.9% NS: 2 gram/100 mL | INTRAVENOUS | Qty: 100

## 2017-04-25 MED FILL — DEXAMETHASONE SODIUM PHOSPHATE 4 MG/ML IJ SOLN: 4 mg/mL | INTRAMUSCULAR | Qty: 10

## 2017-04-25 MED FILL — ROCURONIUM 10 MG/ML IV: 10 mg/mL | INTRAVENOUS | Qty: 40

## 2017-04-25 MED FILL — XYLOCAINE-MPF 20 MG/ML (2 %) INJECTION SOLUTION: 20 mg/mL (2 %) | INTRAMUSCULAR | Qty: 40

## 2018-04-02 ENCOUNTER — Other Ambulatory Visit (HOSPITAL_COMMUNITY): Payer: Self-pay | Admitting: Neurology

## 2018-04-02 DIAGNOSIS — G3184 Mild cognitive impairment, so stated: Secondary | ICD-10-CM

## 2018-04-10 ENCOUNTER — Ambulatory Visit (HOSPITAL_COMMUNITY)
Admission: RE | Admit: 2018-04-10 | Discharge: 2018-04-10 | Disposition: A | Discharge status: Home / Self Care | Payer: Medicare Other | Source: Ambulatory Visit | Attending: Neurology | Admitting: Neurology

## 2018-04-10 DIAGNOSIS — G3184 Mild cognitive impairment, so stated: Secondary | ICD-10-CM

## 2018-04-15 ENCOUNTER — Ambulatory Visit (HOSPITAL_COMMUNITY)
Admission: RE | Admit: 2018-04-15 | Discharge: 2018-04-15 | Disposition: A | Discharge status: Home / Self Care | Payer: Medicare Other | Source: Ambulatory Visit | Attending: Neurology | Admitting: Neurology

## 2018-04-15 DIAGNOSIS — G319 Degenerative disease of nervous system, unspecified: Secondary | ICD-10-CM | POA: Insufficient documentation

## 2018-04-15 DIAGNOSIS — R413 Other amnesia: Secondary | ICD-10-CM | POA: Diagnosis not present

## 2018-04-15 DIAGNOSIS — R9089 Other abnormal findings on diagnostic imaging of central nervous system: Secondary | ICD-10-CM | POA: Diagnosis not present

## 2018-04-15 DIAGNOSIS — G3184 Mild cognitive impairment, so stated: Secondary | ICD-10-CM | POA: Diagnosis present

## 2018-04-27 ENCOUNTER — Other Ambulatory Visit: Payer: Self-pay

## 2018-04-27 ENCOUNTER — Emergency Department
Admission: EM | Admit: 2018-04-27 | Discharge: 2018-04-27 | Disposition: A | Discharge status: Home / Self Care | Payer: Medicare Other | Attending: Student in an Organized Health Care Education/Training Program | Admitting: Student in an Organized Health Care Education/Training Program

## 2018-04-27 ENCOUNTER — Emergency Department: Payer: Medicare Other

## 2018-04-27 ENCOUNTER — Encounter: Payer: Self-pay | Admitting: Emergency Medicine

## 2018-04-27 DIAGNOSIS — Z7982 Long term (current) use of aspirin: Secondary | ICD-10-CM | POA: Diagnosis not present

## 2018-04-27 DIAGNOSIS — W228XXA Striking against or struck by other objects, initial encounter: Secondary | ICD-10-CM | POA: Insufficient documentation

## 2018-04-27 DIAGNOSIS — Y93G1 Activity, food preparation and clean up: Secondary | ICD-10-CM | POA: Insufficient documentation

## 2018-04-27 DIAGNOSIS — Y999 Unspecified external cause status: Secondary | ICD-10-CM | POA: Insufficient documentation

## 2018-04-27 DIAGNOSIS — Y92002 Bathroom of unspecified non-institutional (private) residence single-family (private) house as the place of occurrence of the external cause: Secondary | ICD-10-CM | POA: Insufficient documentation

## 2018-04-27 DIAGNOSIS — S6991XA Unspecified injury of right wrist, hand and finger(s), initial encounter: Secondary | ICD-10-CM | POA: Diagnosis present

## 2018-04-27 DIAGNOSIS — Z79899 Other long term (current) drug therapy: Secondary | ICD-10-CM | POA: Diagnosis not present

## 2018-04-27 HISTORY — DX: Unspecified osteoarthritis, unspecified site: M19.90

## 2018-04-27 HISTORY — DX: Hyperlipidemia, unspecified: E78.5

## 2018-04-27 NOTE — ED Triage Notes (Signed)
Pt was cleaning shower on hands and knees yesterday and hand slipped and jammed it into wall.  Bruising and swelling 3rd digit right hand.

## 2018-04-27 NOTE — ED Provider Notes (Signed)
[facility] Emergency Department Provider Note ____________________________________________  Time seen: 1300  I have reviewed the triage vital signs and the nursing notes.  HISTORY  Chief Complaint  Hand Injury   HPI Allison Ramos is a 77 y.o. female with complaint of right middle finger pain, bruising and swelling.  She reports that she was cleaning her bathtub yesterday when she jammed her right finger into the side of the bathtub.  She does report decreased range of motion.  She has not tried any ice, ibuprofen or anything else over-the-counter.  Past Medical History:  Diagnosis Date  . Hyperlipidemia   . Osteoarthrosis     There are no active problems to display for this patient.   Past Surgical History:  Procedure Laterality Date  . ABDOMINAL HYSTERECTOMY      Prior to Admission medications   Medication Sig Start Date End Date Taking? Authorizing Provider  aspirin 81 MG chewable tablet Chew by mouth daily.   Yes [provider]  atorvastatin (LIPITOR) 40 MG tablet Take 40 mg by mouth daily.   Yes [provider]  levothyroxine (SYNTHROID, LEVOTHROID) 50 MCG tablet Take 50 mcg by mouth daily before breakfast.   Yes [provider]  memantine (NAMENDA) 5 MG tablet Take 5 mg by mouth 2 (two) times daily.   Yes [provider]  pantoprazole (PROTONIX) 40 MG tablet Take 40 mg by mouth daily.   Yes [provider]    Allergies Darvon [propoxyphene]  History reviewed. No pertinent family history.  Social History Social History   Tobacco Use  . Smoking status: Never Smoker  . Smokeless tobacco: Never Used  Substance Use Topics  . Alcohol use: Yes    Alcohol/week: 0.6 oz    Types: 1 Glasses of wine per week  . Drug use: Never    Review of Systems  Constitutional: Negative for fever. Musculoskeletal: Positive for finger pain, swelling and decreased range of motion..   Skin: Positive for  bruising of the middle finger, right hand. Neurological: Negative for headaches, focal weakness or numbness. ____________________________________________  PHYSICAL EXAM:  VITAL SIGNS: ED Triage Vitals  Enc Vitals Group     BP 04/27/18 1216 133/80     Pulse Rate 04/27/18 1217 77     Resp 04/27/18 1216 16     Temp 04/27/18 1216 99 F (37.2 C)     Temp Source 04/27/18 1216 Oral     SpO2 04/27/18 1217 100 %     Weight 04/27/18 1214 173 lb (78.5 kg)     Height 04/27/18 1214 5\' 8"  (1.727 m)     Head Circumference --      Peak Flow --      Pain Score 04/27/18 1214 3     Pain Loc --      Pain Edu? --      Excl. in [place]? --     Constitutional: Alert and oriented. Well appearing and in no distress. Musculoskeletal: Decreased flexion of the third digit right hand.  Normal extension.  Pain with palpation over the PIP right third digit. Neurologic: Normal speech and language. No gross focal neurologic deficits are appreciated. Skin: Bruising noted of the right third digit. ____________________________________________     RADIOLOGY   Imaging Orders     DG Hand Complete Right  IMPRESSION: 1. No acute abnormality. 2. Osteoarthritic changes the hand, most notably at the first [facility] joint. _____________________________________  PROCEDURES  .Splint Application Date/Time: 12/30/6158 1:37 PM Performed by:  Jearld Fenton, NP Authorized by: Jearld Fenton, NP   Consent:    Consent obtained:  Verbal   Consent given by:  Patient   Risks discussed:  Pain   Alternatives discussed:  No treatment Pre-procedure details:    Sensation:  Normal Procedure details:    Laterality:  Right   Location:  Finger   Finger:  R long finger   Strapping: yes     Splint type:  Finger   Supplies:  Aluminum splint Post-procedure details:    Pain:  Unchanged   Sensation:  Normal   Patient tolerance of procedure:  Tolerated well, no immediate  complications    ____________________________________________  INITIAL IMPRESSION / ASSESSMENT AND PLAN / ED COURSE  Finger pain, right hand:  X-ray negative for fracture Splint applied -remove  in 1 week Encouraged ice and ibuprofen every 8 hours as needed for pain and swelling ____________________________________________  FINAL CLINICAL IMPRESSION(S) / ED DIAGNOSES  Final diagnoses:  Injury of finger of right hand, initial encounter      Jearld Fenton, NP 04/27/18 1339    Merlyn Lot, MD 04/27/18 1402

## 2018-04-27 NOTE — ED Notes (Signed)
See triage note  States she jammed her hand while cleaning to bathroom   States she hit her right hand  Jammed middle finger

## 2018-04-27 NOTE — Discharge Instructions (Addendum)
The x-ray of your middle finger was negative for any acute fracture.We have placed your finger in a splint, wear x1 week. Ice and ibuprofen as needed for pain and inflammation.

## 2018-08-17 ENCOUNTER — Emergency Department: Payer: Medicare Other

## 2018-08-17 ENCOUNTER — Emergency Department
Admission: EM | Admit: 2018-08-17 | Discharge: 2018-08-17 | Disposition: A | Discharge status: Home / Self Care | Payer: Medicare Other | Attending: Emergency Medicine | Admitting: Emergency Medicine

## 2018-08-17 ENCOUNTER — Other Ambulatory Visit: Payer: Self-pay

## 2018-08-17 DIAGNOSIS — Z7982 Long term (current) use of aspirin: Secondary | ICD-10-CM | POA: Insufficient documentation

## 2018-08-17 DIAGNOSIS — W208XXA Other cause of strike by thrown, projected or falling object, initial encounter: Secondary | ICD-10-CM | POA: Insufficient documentation

## 2018-08-17 DIAGNOSIS — S9031XA Contusion of right foot, initial encounter: Secondary | ICD-10-CM | POA: Diagnosis not present

## 2018-08-17 DIAGNOSIS — Y9389 Activity, other specified: Secondary | ICD-10-CM | POA: Diagnosis not present

## 2018-08-17 DIAGNOSIS — Y998 Other external cause status: Secondary | ICD-10-CM | POA: Diagnosis not present

## 2018-08-17 DIAGNOSIS — S92424A Nondisplaced fracture of distal phalanx of right great toe, initial encounter for closed fracture: Secondary | ICD-10-CM | POA: Insufficient documentation

## 2018-08-17 DIAGNOSIS — S99921A Unspecified injury of right foot, initial encounter: Secondary | ICD-10-CM | POA: Diagnosis present

## 2018-08-17 DIAGNOSIS — Y929 Unspecified place or not applicable: Secondary | ICD-10-CM | POA: Insufficient documentation

## 2018-08-17 DIAGNOSIS — Z79899 Other long term (current) drug therapy: Secondary | ICD-10-CM | POA: Diagnosis not present

## 2018-08-17 DIAGNOSIS — S90411A Abrasion, right great toe, initial encounter: Secondary | ICD-10-CM | POA: Diagnosis not present

## 2018-08-17 MED ORDER — BACITRACIN-NEOMYCIN-POLYMYXIN 400-5-5000 EX OINT
TOPICAL_OINTMENT | Freq: Once | CUTANEOUS | Status: AC
  Administered 2018-08-17: 1 via TOPICAL
  Filled 2018-08-17: qty 1

## 2018-08-17 MED ORDER — DOUBLE ANTIBIOTIC 500-10000 UNIT/GM EX OINT
TOPICAL_OINTMENT | Freq: Two times a day (BID) | CUTANEOUS | Status: DC
  Filled 2018-08-17: qty 28.4

## 2018-08-17 NOTE — ED Triage Notes (Signed)
First RN: Pt presents to ED with c/o R great toe and 2nd and 3rd toe pain. Pt states dropped a board on her foot last night. Pt ambulatory with steady gait at this time. NAD noted.

## 2018-08-17 NOTE — ED Triage Notes (Signed)
Pt states a board fell on her right foot. Pt is ambulatory NAD.

## 2018-08-17 NOTE — ED Provider Notes (Signed)
[facility] EMERGENCY DEPARTMENT Provider Note   CSN: [id number] Arrival date & time: 08/17/18  1004     History   Chief Complaint Chief Complaint  Patient presents with  . Foot Pain    HPI Allison Ramos is a 77 y.o. female presents to the emergency department for evaluation of right foot pain.  Patient states a board fell onto her right foot 1 day ago, landed across her right great second and third toe.  She has abrasion to the base of the nail of the right great toe.  Pain is moderate.  She has not had any medications for pain.  She is ambulatory with a limp.  She denies any other injury to her foot or ankle.  No pain throughout the tarsal region, calcaneus or ankle.  HPI  Past Medical History:  Diagnosis Date  . Hyperlipidemia   . Osteoarthrosis     There are no active problems to display for this patient.   Past Surgical History:  Procedure Laterality Date  . ABDOMINAL HYSTERECTOMY       OB History   None      Home Medications    Prior to Admission medications   Medication Sig Start Date End Date Taking? Authorizing Provider  aspirin 81 MG chewable tablet Chew by mouth daily.    [provider]  atorvastatin (LIPITOR) 40 MG tablet Take 40 mg by mouth daily.    [provider]  levothyroxine (SYNTHROID, LEVOTHROID) 50 MCG tablet Take 50 mcg by mouth daily before breakfast.    [provider]  memantine (NAMENDA) 5 MG tablet Take 5 mg by mouth 2 (two) times daily.    [provider]  pantoprazole (PROTONIX) 40 MG tablet Take 40 mg by mouth daily.    [provider]    Family History No family history on file.  Social History Social History   Tobacco Use  . Smoking status: Never Smoker  . Smokeless tobacco: Never Used  Substance Use Topics  . Alcohol use: Yes    Alcohol/week: 1.0 standard drinks    Types: 1 Glasses of wine per week  . Drug use: Never     Allergies   Darvon  [propoxyphene]   Review of Systems Review of Systems  Constitutional: Negative for fever.  Musculoskeletal: Positive for arthralgias, gait problem and joint swelling. Negative for myalgias, neck pain and neck stiffness.  Skin: Positive for wound. Negative for color change, pallor and rash.     Physical Exam Updated Vital Signs There were no vitals taken for this visit.  Physical Exam  Constitutional: She is oriented to person, place, and time. She appears well-developed and well-nourished.  HENT:  Head: Normocephalic and atraumatic.  Eyes: Conjunctivae are normal.  Neck: Normal range of motion.  Cardiovascular: Normal rate.  Pulmonary/Chest: Effort normal. No respiratory distress.  Musculoskeletal: She exhibits tenderness.  Examination of the right foot shows slight swelling to the great toe, second and third toe.  There is a subtle 1 cm superficial abrasion to the right great toe along the base of the nail.  No signs of any cellulitis or abscess formation.  Nail is intact.  Patient is tender palpation to the great, second, third toe.  No deformity.  Neurological: She is alert and oriented to person, place, and time.  Skin: Skin is warm. No rash noted.  Psychiatric: She has a normal mood and affect. Her behavior is normal. Thought content normal.     ED  Treatments / Results  Labs (all labs ordered are listed, but only abnormal results are displayed) Labs Reviewed - No data to display  EKG None  Radiology Dg Foot Complete Right  Result Date: 08/17/2018 CLINICAL DATA:  Right foot pain after injury yesterday. EXAM: RIGHT FOOT COMPLETE - 3+ VIEW COMPARISON:  None. FINDINGS: There is no evidence of fracture or dislocation. There is no evidence of arthropathy. Mild posterior calcaneal spurring is noted. Soft tissues are unremarkable. IMPRESSION: No acute abnormality seen in the right foot. Electronically Signed   By: Marijo Conception, M.D.   On: 08/17/2018 10:49     Procedures Procedures (including critical care time)  Medications Ordered in ED Medications  neomycin-bacitracin-polymyxin (NEOSPORIN) ointment (has no administration in time range)     Initial Impression / Assessment and Plan / ED Course  I have reviewed the triage vital signs and the nursing notes.  Pertinent labs & imaging results that were available during my care of the patient were reviewed by me and considered in my medical decision making (see chart for details).     77 year old female with trauma to the right great toe, second and third toes.  She suffered a subtle superficial abrasion that was cleaned with Betadine and antibiotic ointment applied in the emergency department.  X-rays of the foot reviewed by radiologist showed no evidence of acute bony abnormality but after close review, I feel as if there is a subtle nondisplaced distal phalanx fracture to the great toe.  Patient is placed in a postop shoe.  She will use a walker to help with ambulation until she can ambulate with no limp.  Patient will follow-up with podiatrist.  Final Clinical Impressions(s) / ED Diagnoses   Final diagnoses:  Abrasion of right great toe, initial encounter  Contusion of right foot, initial encounter  Closed nondisplaced fracture of distal phalanx of right great toe, initial encounter    ED Discharge Orders    None       [name] 08/17/18 1128    Orbie Pyo, MD 08/17/18 [phone]

## 2018-08-17 NOTE — ED Notes (Signed)
Bruising noted to right great toe. C/o pain.

## 2018-08-17 NOTE — Discharge Instructions (Addendum)
Please use postop shoe to help with ambulation as needed.  Use a walker to ambulate until you are able to walk without a limp.  Apply antibiotic ointment daily to the right great toe.  Call podiatrist tomorrow morning to schedule follow-up appointment for recheck of your right great toe.  Return to the ER for any worsening symptoms or urgent changes in your health.

## 2019-09-24 DIAGNOSIS — G309 Alzheimer's disease, unspecified: Secondary | ICD-10-CM | POA: Diagnosis present

## 2019-09-24 DIAGNOSIS — F0280 Dementia in other diseases classified elsewhere without behavioral disturbance: Secondary | ICD-10-CM | POA: Diagnosis present

## 2019-10-13 ENCOUNTER — Encounter: Payer: Self-pay | Admitting: Emergency Medicine

## 2019-10-13 ENCOUNTER — Other Ambulatory Visit: Payer: Self-pay

## 2019-10-13 ENCOUNTER — Ambulatory Visit
Admission: EM | Admit: 2019-10-13 | Discharge: 2019-10-13 | Disposition: A | Discharge status: Home / Self Care | Payer: Medicare Other | Attending: Family Medicine | Admitting: Family Medicine

## 2019-10-13 DIAGNOSIS — N390 Urinary tract infection, site not specified: Secondary | ICD-10-CM | POA: Insufficient documentation

## 2019-10-13 DIAGNOSIS — R319 Hematuria, unspecified: Secondary | ICD-10-CM | POA: Insufficient documentation

## 2019-10-13 LAB — URINALYSIS, COMPLETE (UACMP) WITH MICROSCOPIC
Bilirubin Urine: NEGATIVE
Glucose, UA: NEGATIVE mg/dL
Ketones, ur: NEGATIVE mg/dL
Nitrite: NEGATIVE
Protein, ur: 100 mg/dL — AB
Specific Gravity, Urine: 1.02 (ref 1.005–1.030)
WBC, UA: >50 WBC/hpf (ref 0–5)
pH: 6 (ref 5.0–8.0)

## 2019-10-13 MED ORDER — CEPHALEXIN 500 MG PO CAPS: 500.0000 mg | ORAL_CAPSULE | Freq: Two times a day (BID) | ORAL | 0 refills | Status: AC

## 2019-10-13 NOTE — Discharge Instructions (Addendum)
Take medication as prescribed. Rest. Drink plenty of fluids.  ° °Follow up with your primary care physician this week as needed. Return to Urgent care for new or worsening concerns.  ° °

## 2019-10-13 NOTE — ED Triage Notes (Signed)
Patient c/o dysuria and urinary frequency that started yesterday.

## 2019-10-13 NOTE — ED Provider Notes (Signed)
[facility] ____________________________________________  Time seen: Approximately 2:05 PM  I have reviewed the triage vital signs and the nursing notes.   HISTORY  Chief Complaint Dysuria and Urinary Urgency   HPI Allison Ramos is a 78 y.o. female presenting for evaluation of 2 days of urinary burning and discomfort.  Denies atypical abdominal pain, back pain, fevers, nausea, vomiting or diarrhea accompanying this.  Has not wanted to drink as much water today due to the burning when she does have to urinate.  Denies aggravating or alleviating factors otherwise.  Reports otherwise doing well.  No recent sickness.  Denies renal insufficiency.  Verdie Shire, MD: PCP   Past Medical History:  Diagnosis Date  . Hyperlipidemia   . Osteoarthrosis     There are no active problems to display for this patient.   Past Surgical History:  Procedure Laterality Date  . ABDOMINAL HYSTERECTOMY       No current facility-administered medications for this encounter.   Current Outpatient Medications:  .  aspirin 81 MG chewable tablet, Chew by mouth daily., Disp: , Rfl:  .  atorvastatin (LIPITOR) 40 MG tablet, Take 40 mg by mouth daily., Disp: , Rfl:  .  levothyroxine (SYNTHROID, LEVOTHROID) 50 MCG tablet, Take 50 mcg by mouth daily before breakfast., Disp: , Rfl:  .  memantine (NAMENDA) 5 MG tablet, Take 5 mg by mouth 2 (two) times daily., Disp: , Rfl:  .  pantoprazole (PROTONIX) 40 MG tablet, Take 40 mg by mouth daily., Disp: , Rfl:  .  cephALEXin (KEFLEX) 500 MG capsule, Take 1 capsule (500 mg total) by mouth 2 (two) times daily for 7 days., Disp: 14 capsule, Rfl: 0  Allergies Darvon [propoxyphene]  History reviewed. No pertinent family history.  Social History Social History   Tobacco Use  . Smoking status: Never Smoker  . Smokeless tobacco: Never Used  Substance Use Topics  . Alcohol use: Yes    Alcohol/week: 1.0 standard drinks    Types: 1 Glasses of wine  per week  . Drug use: Never    Review of Systems Constitutional: No fever ENT: No sore throat. Cardiovascular: Denies chest pain. Respiratory: Denies shortness of breath. Gastrointestinal: No abdominal pain.  No nausea, no vomiting.  No diarrhea.  Genitourinary: Positive for dysuria. Musculoskeletal: Negative for back pain. Skin: Negative for rash.   ____________________________________________   PHYSICAL EXAM:  VITAL SIGNS: ED Triage Vitals [10/13/19 1321]  Enc Vitals Group     BP (!) 142/76     Pulse Rate 72     Resp 18     Temp 98.2 F (36.8 C)     Temp src      SpO2 100 %     Weight 173 lb (78.5 kg)     Height 5\' 8"  (1.727 m)     Head Circumference      Peak Flow      Pain Score 0     Pain Loc      Pain Edu?      Excl. in [place]?     Constitutional: Alert and oriented. Well appearing and in no acute distress. Eyes: Conjunctivae are normal.  ENT      Head: Normocephalic and atraumatic. Cardiovascular: Normal rate, regular rhythm. Grossly normal heart sounds.  Good peripheral circulation. Respiratory: Normal respiratory effort without tachypnea nor retractions. Breath sounds are clear and equal bilaterally. No wheezes, rales, rhonchi. Gastrointestinal: Soft and nontender.  No CVA tenderness. Musculoskeletal: Steady gait. Neurologic:  Normal speech  and language.Speech is normal. No gait instability.  Skin:  Skin is warm, dry  Psychiatric: Mood and affect are normal. Speech and behavior are normal. Patient exhibits appropriate insight and judgment   ___________________________________________   LABS (all labs ordered are listed, but only abnormal results are displayed)  Labs Reviewed  URINALYSIS, COMPLETE (UACMP) WITH MICROSCOPIC - Abnormal; Notable for the following components:      Result Value   APPearance CLOUDY (*)    Hgb urine dipstick MODERATE (*)    Protein, ur 100 (*)    Leukocytes,Ua MODERATE (*)    Bacteria, UA FEW (*)    All other components  within normal limits  URINE CULTURE    PROCEDURES Procedures    INITIAL IMPRESSION / ASSESSMENT AND PLAN / ED COURSE  Pertinent labs & imaging results that were available during my care of the patient were reviewed by me and considered in my medical decision making (see chart for details).  Appearing patient.  No acute distress.  Urinalysis reviewed, UTI.  Will empirically treat with oral Keflex.  Encourage rest, fluids, supportive care. Discussed indication, risks and benefits of medications with patient.  Discussed follow up with Primary care physician this week. Discussed follow up and return parameters including no resolution or any worsening concerns. Patient verbalized understanding and agreed to plan.   ____________________________________________   FINAL CLINICAL IMPRESSION(S) / ED DIAGNOSES  Final diagnoses:  Urinary tract infection with hematuria, site unspecified     ED Discharge Orders         Ordered    cephALEXin (KEFLEX) 500 MG capsule  2 times daily     10/13/19 1347           Note: This dictation was prepared with Dragon dictation along with smaller phrase technology. Any transcriptional errors that result from this process are unintentional.         Marylene Land, NP 10/13/19 1422

## 2019-10-15 ENCOUNTER — Ambulatory Visit: Payer: Medicare Other | Admitting: Speech Pathology

## 2019-10-15 LAB — URINE CULTURE: Culture: 70000 — AB

## 2019-10-16 ENCOUNTER — Telehealth (HOSPITAL_COMMUNITY): Payer: Self-pay | Admitting: Emergency Medicine

## 2019-10-16 NOTE — Telephone Encounter (Signed)
Urine culture was positive for PROTEUS MIRABILIS and was given  keflex at urgent care visit. Pt contacted and made aware, educated on completing antibiotic and to follow up if symptoms are persistent. Verbalized understanding.    

## 2019-10-19 ENCOUNTER — Ambulatory Visit: Payer: Medicare Other | Admitting: Speech Pathology

## 2019-10-22 ENCOUNTER — Ambulatory Visit: Payer: Medicare Other | Admitting: Speech Pathology

## 2019-10-26 ENCOUNTER — Encounter: Payer: Medicare Other | Admitting: Speech Pathology

## 2019-10-26 ENCOUNTER — Ambulatory Visit: Payer: Medicare Other | Admitting: Speech Pathology

## 2019-10-29 ENCOUNTER — Ambulatory Visit: Payer: Medicare Other | Admitting: Speech Pathology

## 2019-10-29 ENCOUNTER — Other Ambulatory Visit: Payer: Self-pay

## 2019-11-02 ENCOUNTER — Ambulatory Visit: Payer: Medicare Other | Attending: Neurology | Admitting: Speech Pathology

## 2019-11-02 ENCOUNTER — Other Ambulatory Visit: Payer: Self-pay

## 2019-11-02 DIAGNOSIS — R41841 Cognitive communication deficit: Secondary | ICD-10-CM | POA: Diagnosis present

## 2019-11-02 DIAGNOSIS — R4701 Aphasia: Secondary | ICD-10-CM

## 2019-11-05 ENCOUNTER — Encounter: Payer: Medicare Other | Admitting: Speech Pathology

## 2019-11-05 ENCOUNTER — Encounter: Payer: Self-pay | Admitting: Speech Pathology

## 2019-11-05 NOTE — Therapy (Signed)
[facility] SERVICES [address] [place], [place], [address] Phone: [phone]   Fax:  [phone]  Speech Language Pathology Evaluation  Patient Details  Name: Allison Ramos MRN: [id number] Date of Birth: 05-06-1941 No data recorded  Encounter Date: 11/02/2019  End of Session - 11/05/19 1016    Visit Number  1    Number of Visits  25    Date for SLP Re-Evaluation  02/02/20    SLP Start Time  1507   pt arrived a few minutes late to appointment   SLP Stop Time   1602    SLP Time Calculation (min)  55 min    Activity Tolerance  Patient tolerated treatment well       Past Medical History:  Diagnosis Date  . Hyperlipidemia   . Osteoarthrosis     Past Surgical History:  Procedure Laterality Date  . ABDOMINAL HYSTERECTOMY      There were no vitals filed for this visit.  Subjective Assessment - 11/05/19 0824    Subjective  Pt was alert, pleasant and cooperative with all evaluation task, often appeared frustrated when she began to experience word finding difficulties.    Patient is accompained by:  Family member   husband   Currently in Pain?  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 Pt appeared frustrated with her word finding difficulties throughout.    Patient/Family Stated Goal  I want to find out what's wrong with my speech.     General Information   HPI  This 78 y/o female presented to [name] on 09/15/2019 due to c/o not being able to verbalize correctly.Husband reports this has been worsening over the past 1-2 years.  Pt reported she often knows the word but can't get it out. She has no hx of head  trauma, MRI revealed mild microvascular ischemic changes only, no significant ischemic events noted. Pt does report family hx of Alzheimer's disease. Pt struggles to perform tasks that she used to perform easily, ie checking emails, text messages. Per her dtr's report when patient is asked to check her email, she may check her text messages instead. Pt struggles to remember names. Pt able to drive without making mistakes or getting lost. Pt stated she is somewhat depressed and has low motivation to do things she once enjoyed. Pt reports when she attempts to read or do crossword puzzles, she experiences blurry vision.  She is here seeking assessment and treatment of her increasingly worsening word finding difficulties.   Mobility Status  WFL      Prior Functional Status   Cognitive/Linguistic Baseline  Within functional limits     Lives With  Spouse    Available Support  Family    Vocation  Retired      Associate Professor   Overall Cognitive Status  Impaired/Different from baseline    Area of Impairment  Orientation;Memory    Orientation Level  Person;Situation   Performance on orientation tasks likely heavily influenced by word finding difficulties   Memory  Decreased short-term memory   Difficult to accurately discern  word finding difficulty from memory impairment   Memory  Impaired    Memory Impairment  Retrieval deficit;Decreased short term memory;Prospective memory;Other (comment)    Awareness  Appears intact    Executive Function  Reasoning;Organizing;Self Monitoring;Self Correcting      Auditory Comprehension   Overall Auditory Comprehension  Appears within functional limits for tasks assessed    Yes/No Questions  Within Functional Limits    Commands  Within Functional Limits    Conversation  Moderately complex      Visual Recognition/Discrimination   Discrimination  Within Function Limits      Reading Comprehension   Reading Status  Impaired    Sentence Level  76-100% accurate     Paragraph Level  76-100% accurate    Interfering Components  Visual scanning;Other (comment)   blurrry vision with reading   Effective Techniques  Verbal cueing      Expression   Primary Mode of Expression  Verbal      Verbal Expression   Overall Verbal Expression  Impaired    Initiation  Impaired    Automatic Speech  Name;Social Response;Counting;Day of week;Month of year    Level of Generative/Spontaneous Verbalization  Conversation    Repetition  No impairment    Naming  Impairment    Responsive  76-100% accurate    Confrontation  75-100% accurate    Convergent  0-24% accurate    Divergent  25-49% accurate    Verbal Errors  mild Perseveration;Aware of errors;Inconsistent    Pragmatics  No impairment    Effective Techniques  Sentence completion;Phonemic cues;Articulatory cues      Written Expression   Dominant Hand  Right    Written Expression  Exceptions to [facility]    Overall Writen Expression  grossly WFL, will further assess in treatment       Oral Motor/Sensory Function   Overall Oral Motor/Sensory Function  Appears within functional limits for tasks assessed      Motor Speech   Overall Motor Speech  Appears within functional limits for tasks assessed    Respiration  Within functional limits    Phonation  Normal    Resonance  Within functional limits    Articulation  Within functional limitis    Intelligibility  Intelligible    Motor Planning  Witnin functional limits      Standardized Assessments   Standardized Assessments   Montreal Cognitive Assessment (MOCA);Western Aphasia Battery revised (Bedside)   Montreal Cognitive Assessment (MOCA)   10/30=mod cognitive impairment: score largely influenced by word finding difficulty    Western Aphasia Battery revised   Bedside Aphasia Score=85; Bedside Language Score=81.25     MOCA, version 8.1   Visuospatial/Executive Functioning:         2/5             Alternating Trail Making: 1/1             Visuoconstruction Skills:  0/1             Draw a clock: 1/3 Naming:                                                          2/3  Attention:  3/6             Forward digit span, 5 digits: 1/1             Reverse digit span, 3 digits: 0/1             Vigilance: 1/1             Serial 7's: 1/3 Language:                                                      1/3             Verbal Fluency 0/1             Repetition: 1/2 Abstraction:                                                   0/2 Delayed Recall:                                              0/5  Memory Index Score: 2/15 Orientation:                                                    2/6   TOTAL      10/30= Mod cognitive impairment  Western Aphasia Battery- Revised Bedside Record  Spontaneous Speech      Information content  6/10       Fluency   9/10     Comprehension     Yes/No questions  9/10          Sequential Commands 10/10    Repetition   9/10     Naming    Object Naming  8/10  Bedside Aphasia Score  85   Reading   7/10 Writing    7/10  Bedside Language Score 81.25      SLP Education - 11/05/19 1015    Education Details  re: role of SLP in aphasia tx    Person(s) Educated  Patient;Spouse    Methods  Explanation    Comprehension  Verbalized understanding;Verbal cues required         SLP Long Term Goals - 11/05/19 1017      SLP LONG TERM GOAL #1   Title  Pt will name/describe objects/pictures with at least 3 semantic features independently.    Time  12    Period  Weeks    Status  New    Target Date  02/02/20      SLP LONG TERM GOAL #2   Title  Pt will answer wh questions re: functional reading tasks with 90% accuracy independently.    Time  12    Period  Weeks    Status  New    Target Date  02/02/20      SLP LONG TERM GOAL #3   Title  Pt will accurately describe a pictured scene providing 3-5  detailed sentences with 80% accuracy given min verbal cues.    Time  12     Period  Weeks    Status  New    Target Date  02/02/20      SLP LONG TERM GOAL #4   Title  Pt will complete visual attention/vigilance/ memory tasks with 80% accuracy given min cues.    Time  12    Period  Weeks    Status  New    Target Date  02/02/20      SLP LONG TERM GOAL #5   Title  Pt will be independent for use of compensatory strategies to aid word finding and short term recall with 80% accuracy.    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 Spontaneous speech is largely fluent in general conversation, fluency decreases with topics outside of general conversation. Some hesitation noted when initiating speech, but once initiated speech is largely fluent composed of short sentences with intermittent pauses with word finding difficulty. In addition noted difficulty with visual scanning for information with functional reading tasks. Difficult to accurately assess true degree of cognitive impairment due to word finding difficulties. Cognition to be further assessed and treated throughout future treatment sessions. Pt will benefit from skilled SLP tx to improve functional communication and cognitive linguistic skills.   Speech Therapy Frequency  2x / week    Duration  Other (comment)   12 weeks   Treatment/Interventions  SLP instruction and feedback;Compensatory strategies;Functional tasks;Cognitive reorganization;Internal/external aids;Patient/family education;Cueing hierarchy    Potential to Achieve Goals  Good    Potential Considerations  Ability to learn/carryover information;Family/community support;Previous level of function;Cooperation/participation level    SLP Home Exercise Plan  TBD    Consulted and  Agree with Plan of Care  Patient;Family member/caregiver       Patient will benefit from skilled therapeutic intervention in order to improve the following deficits and impairments:   Aphasia  Cognitive communication deficit    Problem List There are no active problems to display for this patient.   [place], MA, CCC-SLP 11/05/2019, 10:32 AM  [place] [facility] SERVICES [address] [place], [place], [address] Phone: [phone]   Fax:  [phone]  Name: Allison Ramos MRN: [id number] Date of Birth: 10/23/1941

## 2019-11-09 ENCOUNTER — Ambulatory Visit: Payer: Medicare Other | Admitting: Speech Pathology

## 2019-11-09 ENCOUNTER — Other Ambulatory Visit: Payer: Self-pay

## 2019-11-09 ENCOUNTER — Encounter: Payer: Self-pay | Admitting: Speech Pathology

## 2019-11-09 DIAGNOSIS — R4701 Aphasia: Secondary | ICD-10-CM

## 2019-11-09 DIAGNOSIS — R41841 Cognitive communication deficit: Secondary | ICD-10-CM

## 2019-11-09 NOTE — Therapy (Signed)
[place] MAIN [facility] SERVICES [address] [place], [place], [address] Phone: [phone]   Fax:  [phone]  Speech Language Pathology Treatment  Patient Details  Name: Allison Ramos MRN: [id number] Date of Birth: Oct 10, 1941 No data recorded  Encounter Date: 11/09/2019  End of Session - 11/09/19 1617    Visit Number  2    Number of Visits  25    Date for SLP Re-Evaluation  02/02/20    Authorization - Visit Number  1    Authorization - Number of Visits  10    SLP Start Time  1400    SLP Stop Time   [id number]    SLP Time Calculation (min)  53 min    Activity Tolerance  Patient tolerated treatment well       Past Medical History:  Diagnosis Date  . Hyperlipidemia   . Osteoarthrosis     Past Surgical History:  Procedure Laterality Date  . ABDOMINAL HYSTERECTOMY      There were no vitals filed for this visit.  Subjective Assessment - 11/09/19 1613    Subjective  Pt was very pleasant and cooperative. Husband present and very supportive.    Currently in Pain?  Yes    Pain Score  6     Pain Location  Knee    Pain Orientation  Left    Pain Descriptors / Indicators  Sore;Aching    Pain Type  Chronic pain            ADULT SLP TREATMENT - 11/09/19 0001      General Information   Behavior/Cognition  Alert;Cooperative;Pleasant mood    Patient Positioning  Upright in chair    Oral care provided  N/A    HPI  This 78 y/o female presented to [name] on 09/15/2019 due to c/o not being able to verbalize correctly.Husband reports this has been worsening over the past 1-2 years.  Pt reported she often knows the word but can't get it out. She has no hx of head trauma, MRI revealed mild microvascular ischemic changes only, no significant ischemic events noted. Pt does report family hx of Alzheimer's disease. Pt struggles to perform tasks that she used to perform easily, ie checking emails, text messages. Per her dtr's report when  patient is asked to check her email, she may check her text messages instead. Pt struggles to remember names. Pt able to drive without making mistakes or getting lost. Pt stated she is somewhat depressed and has low motivation to do things she once enjoyed. Pt reports when she attempts to read or do crossword puzzles, she experiences blurry vision.  She is here seeking assessment and treatment of her increasingly worsening word finding difficulties.      Treatment Provided   Treatment provided  Cognitive-Linquistic      Pain Assessment   Pain Assessment  0-10    Pain Score  6     Pain Location  --   L knee     Cognitive-Linquistic Treatment   Treatment focused on  Cognition;Aphasia;Patient/family/caregiver education    Skilled Treatment   Pt named 3-5 semantic features given pictured objects including naming category for pictured item with 100% accuracy given regular min to mod verbal cues. Educated pt & husband at length re: compensatory strategies to aid word finding including: verbal description, synonyms, antonyms, drawing, gesture, and taking a break and returning back later.        Assessment / Recommendations / Plan  Plan  Continue with current plan of care      Progression Toward Goals   Progression toward goals  Progressing toward goals       SLP Education - 11/09/19 1616    Education Details  re: compensatory strategies to aid word finding    Person(s) Educated  Patient;Spouse    Methods  Explanation;Demonstration;Verbal cues;Handout    Comprehension  Verbalized understanding;Need further instruction;Returned demonstration;Verbal cues required         SLP Long Term Goals - 11/05/19 1017      SLP LONG TERM GOAL #1   Title  Pt will name/describe objects/pictures with at least 3 semantic features independently.    Time  12    Period  Weeks    Status  New    Target Date  02/02/20      SLP LONG TERM GOAL #2   Title  Pt will answer wh questions re: functional  reading tasks with 90% accuracy independently.    Time  12    Period  Weeks    Status  New    Target Date  02/02/20      SLP LONG TERM GOAL #3   Title  Pt will accurately describe a pictured scene providing 3-5 detailed sentences with 80% accuracy given min verbal cues.    Time  12    Period  Weeks    Status  New    Target Date  02/02/20      SLP LONG TERM GOAL #4   Title  Pt will complete visual attention/vigilance/ memory tasks with 80% accuracy given min cues.    Time  12    Period  Weeks    Status  New    Target Date  02/02/20      SLP LONG TERM GOAL #5   Title  Pt will be independent for use of compensatory strategies to aid word finding and short term recall with 80% accuracy.    Time  12    Period  Weeks    Status  New    Target Date  02/02/20       Plan - 11/09/19 1617    Clinical Impression Statement  Phrase completion and phonemic cues were most successful in aiding word finding throughout treatment. Pt and husband demonstrated initial understanding of compensatory strategies to aid word finding. Pt will benefit from continued skilled ST tx to promote restoration of cognitive linguistic skills and provide pt education re: compensatory strategies to improve functional independence.   Speech Therapy Frequency  2x / week    Duration  Other (comment)   12 weeks   Treatment/Interventions  SLP instruction and feedback;Compensatory strategies;Functional tasks;Cognitive reorganization;Internal/external aids;Patient/family education;Cueing hierarchy    Potential to Achieve Goals  Good    Potential Considerations  Ability to learn/carryover information;Family/community support;Previous level of function;Cooperation/participation level    SLP Home Exercise Plan  Word finding tasks, home use of compensatory strategies to aid word finding    Consulted and Agree with Plan of Care  Patient;Family member/caregiver    Family Member Consulted  husband       Patient will benefit  from skilled therapeutic intervention in order to improve the following deficits and impairments:   Aphasia  Cognitive communication deficit    Problem List There are no active problems to display for this patient.   [place], MA, CCC-SLP 11/09/2019, 4:29 PM  [place] MAIN [facility] SERVICES [address] [place], [place], [address]  Phone: [phone]   Fax:  [phone]   Name: Allison Ramos MRN: [id number] Date of Birth: [birth year]

## 2019-11-12 ENCOUNTER — Ambulatory Visit: Payer: Medicare Other | Admitting: Speech Pathology

## 2019-11-12 ENCOUNTER — Other Ambulatory Visit: Payer: Self-pay

## 2019-11-12 ENCOUNTER — Encounter: Payer: Self-pay | Admitting: Speech Pathology

## 2019-11-12 DIAGNOSIS — R4701 Aphasia: Secondary | ICD-10-CM

## 2019-11-12 DIAGNOSIS — R41841 Cognitive communication deficit: Secondary | ICD-10-CM

## 2019-11-12 NOTE — Therapy (Signed)
[place] MAIN [facility] SERVICES [address] [place], [place], [address] Phone: [phone]   Fax:  [phone]  Speech Language Pathology Treatment  Patient Details  Name: Allison Ramos MRN: [id number] Date of Birth: 01-25-1941 No data recorded  Encounter Date: 11/12/2019  End of Session - 11/12/19 1506    Visit Number  3    Number of Visits  25    Date for SLP Re-Evaluation  02/02/20    Authorization - Visit Number  2    Authorization - Number of Visits  10    SLP Start Time  1400    SLP Stop Time   1450    SLP Time Calculation (min)  50 min    Activity Tolerance  Patient tolerated treatment well       Past Medical History:  Diagnosis Date  . Hyperlipidemia   . Osteoarthrosis     Past Surgical History:  Procedure Laterality Date  . ABDOMINAL HYSTERECTOMY      There were no vitals filed for this visit.  Subjective Assessment - 11/12/19 1504    Subjective  Pt was very pleasant and cooperative. Husband present and very supportive.    Patient is accompained by:  Family member   husband   Currently in Pain?  Yes    Pain Location  Knee    Pain Orientation  Left    Pain Descriptors / Indicators  Sore    Pain Type  Chronic pain    Pain Onset  1 to 4 weeks ago            ADULT SLP TREATMENT - 11/12/19 0001      General Information   Behavior/Cognition  Alert;Cooperative;Pleasant mood    Patient Positioning  Upright in chair    Oral care provided  N/A    HPI  This 78 y/o female presented to [name] on 09/15/2019 due to c/o not being able to verbalize correctly.Husband reports this has been worsening over the past 1-2 years.  Pt reported she often knows the word but can't get it out. She has no hx of head trauma, MRI revealed mild microvascular ischemic changes only, no significant ischemic events noted. Pt does report family hx of Alzheimer's disease. Pt struggles to perform tasks that she used to perform easily, ie  checking emails, text messages. Per her dtr's report when patient is asked to check her email, she may check her text messages instead. Pt struggles to remember names. Pt able to drive without making mistakes or getting lost. Pt stated she is somewhat depressed and has low motivation to do things she once enjoyed. Pt reports when she attempts to read or do crossword puzzles, she experiences blurry vision.  She is here seeking assessment and treatment of her increasingly worsening word finding difficulties.      Treatment Provided   Treatment provided  Cognitive-Linquistic      Pain Assessment   Pain Assessment  0-10    Pain Location  --   L knee   Pain Descriptors / Indicators  Sore    Pain Intervention(s)  Limited activity within patient's tolerance;Monitored during session      Cognitive-Linquistic Treatment   Treatment focused on  Cognition;Aphasia;Patient/family/caregiver education    Skilled Treatment  Pt named 3-5 semantic features given pictured objects including naming category for pictured item with 80% accuracy given regular min to mod verbal cues. Educated pt & husband re: compensatory strategies to aid word finding, discussed describing  the function and the category of the item trying to be named to elicit efficient word finding.      Assessment / Recommendations / Plan   Plan  Continue with current plan of care      Progression Toward Goals   Progression toward goals  Progressing toward goals       SLP Education - 11/12/19 1506    Education Details  [id number] strategies to aid word finding    Person(s) Educated  Patient;Spouse    Methods  Explanation;Demonstration;Verbal cues    Comprehension  Verbalized understanding;Need further instruction;Returned demonstration;Verbal cues required         SLP Long Term Goals - 11/05/19 1017      SLP LONG TERM GOAL #1   Title  Pt will name/describe objects/pictures with at least 3 semantic features independently.     Time  12    Period  Weeks    Status  New    Target Date  02/02/20      SLP LONG TERM GOAL #2   Title  Pt will answer wh questions re: functional reading tasks with 90% accuracy independently.    Time  12    Period  Weeks    Status  New    Target Date  02/02/20      SLP LONG TERM GOAL #3   Title  Pt will accurately describe a pictured scene providing 3-5 detailed sentences with 80% accuracy given min verbal cues.    Time  12    Period  Weeks    Status  New    Target Date  02/02/20      SLP LONG TERM GOAL #4   Title  Pt will complete visual attention/vigilance/ memory tasks with 80% accuracy given min cues.    Time  12    Period  Weeks    Status  New    Target Date  02/02/20      SLP LONG TERM GOAL #5   Title  Pt will be independent for use of compensatory strategies to aid word finding and short term recall with 80% accuracy.    Time  12    Period  Weeks    Status  New    Target Date  02/02/20       Plan - 11/12/19 1506    Clinical Impression Statement  Pt demonstrated increased word finding difficulties throughout treatment in comparison to previous sessions. Phrase completion and phonemic cues continue to be the most successful in aiding word finding throughout treatment. Pt will benefit from continued skilled ST tx to promote restoration of cognitive linguistic skills and provide pt education re: compensatory strategies to improve functional independence.   Speech Therapy Frequency  2x / week    Duration  Other (comment)   12 weeks   Treatment/Interventions  SLP instruction and feedback;Compensatory strategies;Functional tasks;Cognitive reorganization;Internal/external aids;Patient/family education;Cueing hierarchy    Potential to Achieve Goals  Good    Potential Considerations  Ability to learn/carryover information;Family/community support;Previous level of function;Cooperation/participation level    SLP Home Exercise Plan  Word finding tasks, home use of compensatory  strategies to aid word finding    Consulted and Agree with Plan of Care  Patient    Family Member Consulted  husband       Patient will benefit from skilled therapeutic intervention in order to improve the following deficits and impairments:   Aphasia  Cognitive communication deficit    Problem List There are no active problems  to display for this patient.   Louretta Tantillo, MA, CCC-SLP 11/12/2019, 3:07 PM  [place] MAIN [facility] SERVICES [address] [place], [place], [address] Phone: [phone]   Fax:  [phone]   Name: Allison Ramos MRN: [id number] Date of Birth: [birth year]

## 2019-11-15 ENCOUNTER — Other Ambulatory Visit: Payer: Self-pay

## 2019-11-15 ENCOUNTER — Encounter: Payer: Self-pay | Admitting: Emergency Medicine

## 2019-11-15 ENCOUNTER — Ambulatory Visit
Admission: EM | Admit: 2019-11-15 | Discharge: 2019-11-15 | Disposition: A | Discharge status: Home / Self Care | Payer: Medicare Other | Attending: Family Medicine | Admitting: Family Medicine

## 2019-11-15 DIAGNOSIS — N3001 Acute cystitis with hematuria: Secondary | ICD-10-CM | POA: Diagnosis present

## 2019-11-15 LAB — URINALYSIS, COMPLETE (UACMP) WITH MICROSCOPIC
Bilirubin Urine: NEGATIVE
Glucose, UA: NEGATIVE mg/dL
Ketones, ur: NEGATIVE mg/dL
Nitrite: NEGATIVE
Protein, ur: NEGATIVE mg/dL
RBC / HPF: >50 RBC/hpf (ref 0–5)
Specific Gravity, Urine: 1.02 (ref 1.005–1.030)
WBC, UA: >50 WBC/hpf (ref 0–5)
pH: 7 (ref 5.0–8.0)

## 2019-11-15 MED ORDER — SULFAMETHOXAZOLE-TRIMETHOPRIM 800-160 MG PO TABS: 1.0000 | ORAL_TABLET | Freq: Two times a day (BID) | ORAL | 0 refills | Status: AC

## 2019-11-15 NOTE — ED Triage Notes (Signed)
Patient c/o pain when urinating and lower pelvic pain that started a hour ago.  Patient denies fever.  Patient denies N/V/D.

## 2019-11-15 NOTE — Discharge Instructions (Signed)
UTI: Your urinalysis did show signs of UTI. Begin Bactrim DS at this time. Urine has been sent for culture. We will call if treatment needs to be amended. Increase rest and fluid intake. Return or go to ER for fever, back pain, worsening urinary pain, discharge, increased blood in urine. Antibiotics should clear symptoms over the next 2-3 days.

## 2019-11-15 NOTE — ED Provider Notes (Signed)
[facility]    CSN: [id number] Arrival date & time: 11/15/19  1258      History   Chief Complaint Chief Complaint  Patient presents with  . Dysuria  . Pelvic Pain    HPI Allison Ramos is a 78 y.o. female. Patient presents for lower abdominal pressure, difficulty urinating and pain with urinating that began just prior to arrival. She denies fever, severe abdominal or back pain. Denies n/v. No blood in urine or vaginal discharge. She believes she may have a urinary tract infection. Denies change in bowel habits. She says she is having a lot of difficulty urinating at this time. No other concerns today.  HPI  Past Medical History:  Diagnosis Date  . Hyperlipidemia   . Osteoarthrosis     There are no active problems to display for this patient.   Past Surgical History:  Procedure Laterality Date  . ABDOMINAL HYSTERECTOMY      OB History   No obstetric history on file.      Home Medications    Prior to Admission medications   Medication Sig Start Date End Date Taking? Authorizing Provider  aspirin 81 MG chewable tablet Chew by mouth daily.   Yes [provider]  atorvastatin (LIPITOR) 40 MG tablet Take 40 mg by mouth daily.   Yes [provider]  levothyroxine (SYNTHROID, LEVOTHROID) 50 MCG tablet Take 50 mcg by mouth daily before breakfast.   Yes [provider]  memantine (NAMENDA) 5 MG tablet Take 5 mg by mouth 2 (two) times daily.   Yes [provider]  pantoprazole (PROTONIX) 40 MG tablet Take 40 mg by mouth daily.   Yes [provider]  sulfamethoxazole-trimethoprim (BACTRIM DS) 800-160 MG tablet Take 1 tablet by mouth 2 (two) times daily for 7 days. 11/15/19 11/22/19  Danton Clap, PA-C    Family History History reviewed. No pertinent family history.  Social History Social History   Tobacco Use  . Smoking status: Never Smoker  . Smokeless tobacco: Never Used  Substance Use Topics  .  Alcohol use: Yes    Alcohol/week: 1.0 standard drinks    Types: 1 Glasses of wine per week  . Drug use: Never     Allergies   Darvon [propoxyphene]   Review of Systems Review of Systems  Constitutional: Negative for chills, fatigue and fever.  Gastrointestinal: Negative for abdominal pain, constipation, diarrhea, nausea and vomiting.  Genitourinary: Positive for difficulty urinating, dysuria, pelvic pain and urgency. Negative for hematuria, vaginal discharge and vaginal pain.  Musculoskeletal: Positive for back pain. Negative for arthralgias and myalgias.  Skin: Negative for rash.  Neurological: Negative for weakness.     Physical Exam Triage Vital Signs ED Triage Vitals  Enc Vitals Group     BP 11/15/19 1320 138/81     Pulse Rate 11/15/19 1320 72     Resp 11/15/19 1320 16     Temp 11/15/19 1320 98.3 F (36.8 C)     Temp Source 11/15/19 1320 Oral     SpO2 11/15/19 1320 98 %     Weight 11/15/19 1318 173 lb 1 oz (78.5 kg)     Height 11/15/19 1318 5\' 8"  (1.727 m)     Head Circumference --      Peak Flow --      Pain Score 11/15/19 1318 8     Pain Loc --      Pain Edu? --      Excl. in [place]? --  No data found.  Updated Vital Signs BP 138/81 (BP Location: Left Arm)   Pulse 72   Temp 98.3 F (36.8 C) (Oral)   Resp 16   Ht 5\' 8"  (1.727 m)   Wt 173 lb 1 oz (78.5 kg)   SpO2 98%   BMI 26.31 kg/m       Physical Exam Vitals signs and nursing note reviewed.  Constitutional:      General: She is not in acute distress.    Appearance: Normal appearance. She is normal weight. She is not ill-appearing.  HENT:     Head: Normocephalic and atraumatic.  Eyes:     General:        Right eye: No discharge.        Left eye: No discharge.     Conjunctiva/sclera: Conjunctivae normal.  Cardiovascular:     Rate and Rhythm: Normal rate and regular rhythm.     Heart sounds: Normal heart sounds. No murmur.  Pulmonary:     Effort: Pulmonary effort is normal. No respiratory  distress.     Breath sounds: Normal breath sounds.  Abdominal:     General: Abdomen is flat. Bowel sounds are normal.     Tenderness: There is abdominal tenderness (mild suprapubic TTP). There is no right CVA tenderness, left CVA tenderness, guarding or rebound.  Skin:    General: Skin is warm and dry.     Findings: No rash.  Neurological:     General: No focal deficit present.     Mental Status: She is alert. Mental status is at baseline.     Motor: No weakness.     Gait: Gait normal.  Psychiatric:        Mood and Affect: Mood normal.        Behavior: Behavior normal.        Thought Content: Thought content normal.      UC Treatments / Results  Labs (all labs ordered are listed, but only abnormal results are displayed) Labs Reviewed  URINALYSIS, COMPLETE (UACMP) WITH MICROSCOPIC - Abnormal; Notable for the following components:      Result Value   APPearance CLOUDY (*)    Hgb urine dipstick MODERATE (*)    Leukocytes,Ua SMALL (*)    Bacteria, UA MANY (*)    All other components within normal limits  URINE CULTURE    EKG   Radiology No results found.  Procedures Procedures (including critical care time)  Medications Ordered in UC Medications - No data to display  Initial Impression / Assessment and Plan / UC Course  I have reviewed the triage vital signs and the nursing notes.  Pertinent labs & imaging results that were available during my care of the patient were reviewed by me and considered in my medical decision making (see chart for details).      Final Clinical Impressions(s) / UC Diagnoses   Final diagnoses:  Acute cystitis with hematuria     Discharge Instructions     UTI: Your urinalysis did show signs of UTI. Begin Bactrim DS at this time. Urine has been sent for culture. We will call if treatment needs to be amended. Increase rest and fluid intake. Return or go to ER for fever, back pain, worsening urinary pain, discharge, increased blood in  urine. Antibiotics should clear symptoms over the next 2-3 days.        ED Prescriptions    Medication Sig Dispense Auth. Provider   sulfamethoxazole-trimethoprim (BACTRIM DS) 800-160 MG tablet  Take 1 tablet by mouth 2 (two) times daily for 7 days. 14 tablet Gretta Cool     PDMP not reviewed this encounter.   Danton Clap, PA-C 11/15/19 1646

## 2019-11-16 ENCOUNTER — Ambulatory Visit: Payer: Medicare Other | Admitting: Speech Pathology

## 2019-11-17 LAB — URINE CULTURE: Culture: >=100000 — AB

## 2019-11-23 ENCOUNTER — Ambulatory Visit: Payer: Medicare Other | Admitting: Speech Pathology

## 2019-11-23 ENCOUNTER — Encounter: Payer: Self-pay | Admitting: Speech Pathology

## 2019-11-23 ENCOUNTER — Other Ambulatory Visit: Payer: Self-pay

## 2019-11-23 DIAGNOSIS — R4701 Aphasia: Secondary | ICD-10-CM

## 2019-11-23 DIAGNOSIS — R41841 Cognitive communication deficit: Secondary | ICD-10-CM

## 2019-11-23 NOTE — Therapy (Signed)
[place] MAIN [facility] SERVICES [address] [place], [place], [address] Phone: [phone]   Fax:  [phone]  Speech Language Pathology Treatment  Patient Details  Name: Allison Ramos MRN: [id number] Date of Birth: 12-26-1940 No data recorded  Encounter Date: 11/23/2019  End of Session - 11/23/19 1546    Visit Number  4    Number of Visits  25    Date for SLP Re-Evaluation  02/02/20    Authorization - Visit Number  3    Authorization - Number of Visits  10    SLP Start Time  [id number]    SLP Stop Time   [id number]    SLP Time Calculation (min)  58 min    Activity Tolerance  Patient tolerated treatment well       Past Medical History:  Diagnosis Date  . Hyperlipidemia   . Osteoarthrosis     Past Surgical History:  Procedure Laterality Date  . ABDOMINAL HYSTERECTOMY      There were no vitals filed for this visit.  Subjective Assessment - 11/23/19 1543    Subjective  Pt was very pleasant and cooperative. She appeared to enjoy tx, smiling and joking often. Husband present and very supportive.    Patient is accompained by:  Family member   husband   Currently in Pain?  Other (Comment)   Overall, achy due to the weather per patient           ADULT SLP TREATMENT - 11/23/19 0001      General Information   Behavior/Cognition  Alert;Cooperative;Pleasant mood    Patient Positioning  Upright in chair    Oral care provided  N/A    HPI  This 78 y/o female presented to [name] on 09/15/2019 due to c/o not being able to verbalize correctly.Husband reports this has been worsening over the past 1-2 years.  Pt reported she often knows the word but can't get it out. She has no hx of head trauma, MRI revealed mild microvascular ischemic changes only, no significant ischemic events noted. Pt does report family hx of Alzheimer's disease. Pt struggles to perform tasks that she used to perform easily, ie checking emails, text messages. Per her  dtr's report when patient is asked to check her email, she may check her text messages instead. Pt struggles to remember names. Pt able to drive without making mistakes or getting lost. Pt stated she is somewhat depressed and has low motivation to do things she once enjoyed. Pt reports when she attempts to read or do crossword puzzles, she experiences blurry vision.  She is here seeking assessment and treatment of her increasingly worsening word finding difficulties.      Treatment Provided   Treatment provided  Cognitive-Linquistic      Cognitive-Linquistic Treatment   Treatment focused on  Cognition;Aphasia;Patient/family/caregiver education    Skilled Treatment  Pt named pictured objects given phonemic and/or phrase completion cues as well as verbal cues to describe pictured object w/semantic features when having word finding difficulty with 90% accuracy (70% accuracy independently.)      Assessment / Recommendations / Plan   Plan  Continue with current plan of care      Progression Toward Goals   Progression toward goals  Progressing toward goals       SLP Education - 11/23/19 1545    Education Details  re: compensatory strategies to aid word finding    Person(s) Educated  Patient;Spouse    Methods  Explanation;Demonstration;Verbal cues    Comprehension  Need further instruction;Verbalized understanding;Returned demonstration;Verbal cues required         SLP Long Term Goals - 11/05/19 1017      SLP LONG TERM GOAL #1   Title  Pt will name/describe objects/pictures with at least 3 semantic features independently.    Time  12    Period  Weeks    Status  New    Target Date  02/02/20      SLP LONG TERM GOAL #2   Title  Pt will answer wh questions re: functional reading tasks with 90% accuracy independently.    Time  12    Period  Weeks    Status  New    Target Date  02/02/20      SLP LONG TERM GOAL #3   Title  Pt will accurately describe a pictured scene providing 3-5  detailed sentences with 80% accuracy given min verbal cues.    Time  12    Period  Weeks    Status  New    Target Date  02/02/20      SLP LONG TERM GOAL #4   Title  Pt will complete visual attention/vigilance/ memory tasks with 80% accuracy given min cues.    Time  12    Period  Weeks    Status  New    Target Date  02/02/20      SLP LONG TERM GOAL #5   Title  Pt will be independent for use of compensatory strategies to aid word finding and short term recall with 80% accuracy.    Time  12    Period  Weeks    Status  New    Target Date  02/02/20       Plan - 11/23/19 1546    Clinical Impression Statement  Pt demonstrated improved independence using compensatory strategies to aid word finding throughout tx today. She often attempted to describe the target item when she had word finding difficulties instead of needing clinician cues to begin object description. Also noted improved speed with confrontation naming tasks. Pt will benefit from continued skilled ST tx to promote restoration of cognitive linguistic skills and provide pt education re: compensatory strategies to improve functional independence.   Speech Therapy Frequency  2x / week    Duration  Other (comment)   12 weeks   Treatment/Interventions  SLP instruction and feedback;Compensatory strategies;Functional tasks;Cognitive reorganization;Internal/external aids;Patient/family education;Cueing hierarchy    Potential to Achieve Goals  Good    Potential Considerations  Ability to learn/carryover information;Family/community support;Previous level of function;Cooperation/participation level    SLP Home Exercise Plan  Word finding tasks, home use of compensatory strategies to aid word finding    Consulted and Agree with Plan of Care  Patient;Family member/caregiver    Family Member Consulted  husband       Patient will benefit from skilled therapeutic intervention in order to improve the following deficits and impairments:    Aphasia  Cognitive communication deficit    Problem List There are no active problems to display for this patient.   [place], MA, CCC-SLP 11/23/2019, 3:59 PM  [place] MAIN [facility] SERVICES [address] [place], [place], [address] Phone: [phone]   Fax:  [phone]   Name: Allison Ramos MRN: [id number] Date of Birth: 1941/05/08

## 2019-11-26 ENCOUNTER — Encounter: Payer: Self-pay | Admitting: Speech Pathology

## 2019-11-26 ENCOUNTER — Ambulatory Visit: Payer: Medicare Other | Attending: Neurology | Admitting: Speech Pathology

## 2019-11-26 ENCOUNTER — Other Ambulatory Visit: Payer: Self-pay

## 2019-11-26 DIAGNOSIS — R4701 Aphasia: Secondary | ICD-10-CM | POA: Diagnosis present

## 2019-11-26 DIAGNOSIS — R41841 Cognitive communication deficit: Secondary | ICD-10-CM | POA: Diagnosis present

## 2019-11-26 NOTE — Therapy (Signed)
[place] MAIN [facility] SERVICES [address] [place], [place], [address] Phone: [phone]   Fax:  [phone]  Speech Language Pathology Treatment  Patient Details  Name: Allison Ramos MRN: [id number] Date of Birth: 02-Apr-1941 No data recorded  Encounter Date: 11/26/2019  End of Session - 11/26/19 1228    Visit Number  5    Number of Visits  25    Date for SLP Re-Evaluation  02/02/20    Authorization - Visit Number  4    Authorization - Number of Visits  10    SLP Start Time  [id number]    SLP Stop Time   1159    SLP Time Calculation (min)  56 min    Activity Tolerance  Patient tolerated treatment well       Past Medical History:  Diagnosis Date  . Hyperlipidemia   . Osteoarthrosis     Past Surgical History:  Procedure Laterality Date  . ABDOMINAL HYSTERECTOMY      There were no vitals filed for this visit.  Subjective Assessment - 11/26/19 1227    Subjective  Pt was very pleasant and cooperative. She appeared to enjoy tx, smiling and joking often. Husband present and very supportive.            ADULT SLP TREATMENT - 11/26/19 0001      General Information   Behavior/Cognition  Alert;Cooperative;Pleasant mood    Patient Positioning  Upright in chair    Oral care provided  N/A    HPI  This 78 y/o female presented to [name] on 09/15/2019 due to c/o not being able to verbalize correctly.Husband reports this has been worsening over the past 1-2 years.  Pt reported she often knows the word but can't get it out. She has no hx of head trauma, MRI revealed mild microvascular ischemic changes only, no significant ischemic events noted. Pt does report family hx of Alzheimer's disease. Pt struggles to perform tasks that she used to perform easily, ie checking emails, text messages. Per her dtr's report when patient is asked to check her email, she may check her text messages instead. Pt struggles to remember names. Pt able to drive  without making mistakes or getting lost. Pt stated she is somewhat depressed and has low motivation to do things she once enjoyed. Pt reports when she attempts to read or do crossword puzzles, she experiences blurry vision.  She is here seeking assessment and treatment of her increasingly worsening word finding difficulties.      Treatment Provided   Treatment provided  Cognitive-Linquistic      Cognitive-Linquistic Treatment   Treatment focused on  Cognition;Aphasia;Patient/family/caregiver education    Skilled Treatment   Pt named pictured objects given mod phonemic and/or phrase completion cues as well as verbal cues to describe pictured object w/semantic features when having word finding difficulty with 80% accuracy (65% accuracy independently.)  Pt completed divided attention tasks (trail making) with 80% accuracy given regular verbal and written cues.       Assessment / Recommendations / Plan   Plan  Continue with current plan of care      Progression Toward Goals   Progression toward goals  Progressing toward goals       SLP Education - 11/26/19 1227    Education Details  re: compensatory strategies to aid word finding    Person(s) Educated  Patient;Spouse    Methods  Explanation;Demonstration;Verbal cues;Handout    Comprehension  Verbalized understanding;Need further  instruction;Returned demonstration;Verbal cues required         SLP Long Term Goals - 11/05/19 1017      SLP LONG TERM GOAL #1   Title  Pt will name/describe objects/pictures with at least 3 semantic features independently.    Time  12    Period  Weeks    Status  New    Target Date  02/02/20      SLP LONG TERM GOAL #2   Title  Pt will answer wh questions re: functional reading tasks with 90% accuracy independently.    Time  12    Period  Weeks    Status  New    Target Date  02/02/20      SLP LONG TERM GOAL #3   Title  Pt will accurately describe a pictured scene providing 3-5 detailed sentences  with 80% accuracy given min verbal cues.    Time  12    Period  Weeks    Status  New    Target Date  02/02/20      SLP LONG TERM GOAL #4   Title  Pt will complete visual attention/vigilance/ memory tasks with 80% accuracy given min cues.    Time  12    Period  Weeks    Status  New    Target Date  02/02/20      SLP LONG TERM GOAL #5   Title  Pt will be independent for use of compensatory strategies to aid word finding and short term recall with 80% accuracy.    Time  12    Period  Weeks    Status  New    Target Date  02/02/20       Plan - 11/26/19 1228    Clinical Impression Statement  Pt required increased level of cueing to aid word finding throughout tx today. Regular verbal and written cues were required for accuracy with divided attention tasks. However continue to note emerging independence with use of compensatory strategies. Home practice instructions and materials provided to increase fluency and speed with confrontation naming. Pt will benefit from continued skilled ST tx to promote restoration of cognitive linguistic skills and provide pt education re: compensatory strategies to improve functional independence.   Speech Therapy Frequency  2x / week    Duration  Other (comment)   12 weeks   Treatment/Interventions  SLP instruction and feedback;Compensatory strategies;Functional tasks;Cognitive reorganization;Internal/external aids;Patient/family education;Cueing hierarchy    Potential to Achieve Goals  Good    Potential Considerations  Ability to learn/carryover information;Family/community support;Previous level of function;Cooperation/participation level    SLP Home Exercise Plan  Word finding tasks, home use of compensatory strategies to aid word finding    Consulted and Agree with Plan of Care  Patient;Family member/caregiver    Family Member Consulted  husband       Patient will benefit from skilled therapeutic intervention in order to improve the following deficits  and impairments:   Aphasia  Cognitive communication deficit    Problem List There are no active problems to display for this patient.   [place], MA, CCC-SLP 11/26/2019, 12:40 PM  [place] MAIN [facility] SERVICES [address] [place], [place], [address] Phone: [phone]   Fax:  [phone]   Name: Allison Ramos MRN: [id number] Date of Birth: 11-22-1941

## 2019-11-30 ENCOUNTER — Ambulatory Visit: Payer: Medicare Other | Admitting: Speech Pathology

## 2019-11-30 ENCOUNTER — Other Ambulatory Visit: Payer: Self-pay

## 2019-11-30 DIAGNOSIS — R4701 Aphasia: Secondary | ICD-10-CM | POA: Diagnosis not present

## 2019-11-30 DIAGNOSIS — R41841 Cognitive communication deficit: Secondary | ICD-10-CM

## 2019-11-30 NOTE — Therapy (Signed)
[place] MAIN [facility] SERVICES [address] [place], [place], [address] Phone: [phone]   Fax:  [phone]  Speech Language Pathology Treatment  Patient Details  Name: Allison Ramos MRN: [id number] Date of Birth: 09-02-1941 No data recorded  Encounter Date: 11/30/2019  End of Session - 11/30/19 1620    Visit Number  6    Number of Visits  25    Date for SLP Re-Evaluation  02/02/20    Authorization - Visit Number  5    Authorization - Number of Visits  10    SLP Start Time  [id number]    SLP Stop Time   [id number]    SLP Time Calculation (min)  55 min    Activity Tolerance  Patient tolerated treatment well       Past Medical History:  Diagnosis Date  . Hyperlipidemia   . Osteoarthrosis     Past Surgical History:  Procedure Laterality Date  . ABDOMINAL HYSTERECTOMY      There were no vitals filed for this visit.  Subjective Assessment - 11/30/19 1619    Subjective  Pt was very pleasant and cooperative. She appeared to enjoy tx, smiling and joking often. Husband present and very supportive.            ADULT SLP TREATMENT - 11/30/19 0001      General Information   Behavior/Cognition  Alert;Cooperative;Pleasant mood    Patient Positioning  Upright in chair    Oral care provided  N/A    HPI  This 78 y/o female presented to [name] on 09/15/2019 due to c/o not being able to verbalize correctly.Husband reports this has been worsening over the past 1-2 years.  Pt reported she often knows the word but can't get it out. She has no hx of head trauma, MRI revealed mild microvascular ischemic changes only, no significant ischemic events noted. Pt does report family hx of Alzheimer's disease. Pt struggles to perform tasks that she used to perform easily, ie checking emails, text messages. Per her dtr's report when patient is asked to check her email, she may check her text messages instead. Pt struggles to remember names. Pt able to drive  without making mistakes or getting lost. Pt stated she is somewhat depressed and has low motivation to do things she once enjoyed. Pt reports when she attempts to read or do crossword puzzles, she experiences blurry vision.  She is here seeking assessment and treatment of her increasingly worsening word finding difficulties.      Treatment Provided   Treatment provided  Cognitive-Linquistic      Cognitive-Linquistic Treatment   Treatment focused on  Cognition;Aphasia;Patient/family/caregiver education    Skilled Treatment  Pt reworded illogical sentences with an appropriate word with 90% accuracy given min verbal cues.  Pt produced synonyms given target words in sentences with 80% accuracy given min to mod verbal cues. Pt named 3-5 semantic features given pictured objects including naming category for pictured item with 80% accuracy given regular mod verbal cues. Pt named pictured objects given phonemic and/or phrase completion cues as well as verbal cues to describe pictured object w/semantic features when having word finding difficulty with 90% accuracy (75% accuracy independently.)      Assessment / Recommendations / Plan   Plan  Continue with current plan of care      Progression Toward Goals   Progression toward goals  Progressing toward goals       SLP Education - 11/30/19 1620  Education Details  re: compensatory strategies to aid word finding    Person(s) Educated  Patient;Spouse    Methods  Explanation;Demonstration    Comprehension  Verbalized understanding;Need further instruction;Returned demonstration;Verbal cues required         SLP Long Term Goals - 11/05/19 1017      SLP LONG TERM GOAL #1   Title  Pt will name/describe objects/pictures with at least 3 semantic features independently.    Time  12    Period  Weeks    Status  New    Target Date  02/02/20      SLP LONG TERM GOAL #2   Title  Pt will answer wh questions re: functional reading tasks with 90%  accuracy independently.    Time  12    Period  Weeks    Status  New    Target Date  02/02/20      SLP LONG TERM GOAL #3   Title  Pt will accurately describe a pictured scene providing 3-5 detailed sentences with 80% accuracy given min verbal cues.    Time  12    Period  Weeks    Status  New    Target Date  02/02/20      SLP LONG TERM GOAL #4   Title  Pt will complete visual attention/vigilance/ memory tasks with 80% accuracy given min cues.    Time  12    Period  Weeks    Status  New    Target Date  02/02/20      SLP LONG TERM GOAL #5   Title  Pt will be independent for use of compensatory strategies to aid word finding and short term recall with 80% accuracy.    Time  12    Period  Weeks    Status  New    Target Date  02/02/20       Plan - 11/30/19 1621    Clinical Impression Statement  Phrase completion and letter cues are most effective in aiding word finding when pt unable to cue herself thru use of compensatory strategies. Home practice instructions and materials provided to increase fluency and speed with confrontation naming. Pt will benefit from continued skilled ST tx to promote restoration of cognitive linguistic skills and provide pt education re: compensatory strategies to improve functional independence.   Speech Therapy Frequency  2x / week    Duration  Other (comment)   12 weeks   Treatment/Interventions  SLP instruction and feedback;Compensatory strategies;Functional tasks;Cognitive reorganization;Internal/external aids;Patient/family education;Cueing hierarchy    Potential to Achieve Goals  Good    Potential Considerations  Ability to learn/carryover information;Family/community support;Previous level of function;Cooperation/participation level    SLP Home Exercise Plan  Word finding tasks, home use of compensatory strategies to aid word finding    Consulted and Agree with Plan of Care  Patient;Family member/caregiver    Family Member Consulted  husband        Patient will benefit from skilled therapeutic intervention in order to improve the following deficits and impairments:   Aphasia  Cognitive communication deficit    Problem List There are no active problems to display for this patient.   [place], MA, CCC-SLP 11/30/2019, 4:33 PM  [place] MAIN [facility] SERVICES [address] [place], [place], [address] Phone: [phone]   Fax:  [phone]   Name: Taquesha Redditt MRN: [id number] Date of Birth: [birth year]

## 2019-12-03 ENCOUNTER — Encounter: Payer: Medicare Other | Admitting: Speech Pathology

## 2019-12-07 ENCOUNTER — Encounter: Payer: Self-pay | Admitting: Speech Pathology

## 2019-12-07 ENCOUNTER — Ambulatory Visit: Payer: Medicare Other | Admitting: Speech Pathology

## 2019-12-07 ENCOUNTER — Other Ambulatory Visit: Payer: Self-pay

## 2019-12-07 DIAGNOSIS — R4701 Aphasia: Secondary | ICD-10-CM | POA: Diagnosis not present

## 2019-12-07 DIAGNOSIS — R41841 Cognitive communication deficit: Secondary | ICD-10-CM

## 2019-12-07 NOTE — Therapy (Signed)
[place] MAIN [facility] SERVICES [address] [place], [place], [address] Phone: [phone]   Fax:  [phone]  Speech Language Pathology Treatment  Patient Details  Name: Allison Ramos MRN: [id number] Date of Birth: 1941/08/16 No data recorded  Encounter Date: 12/07/2019  End of Session - 12/07/19 1509    Visit Number  7    Number of Visits  25    Date for SLP Re-Evaluation  02/02/20    Authorization - Visit Number  6    Authorization - Number of Visits  10    SLP Start Time  1400    SLP Stop Time   [id number]    SLP Time Calculation (min)  58 min    Activity Tolerance  Patient tolerated treatment well       Past Medical History:  Diagnosis Date  . Hyperlipidemia   . Osteoarthrosis     Past Surgical History:  Procedure Laterality Date  . ABDOMINAL HYSTERECTOMY      There were no vitals filed for this visit.  Subjective Assessment - 12/07/19 1506    Subjective  Pt and husband related their disappointment with recent new neurologist consult and frustration with how testing was presented. Pt reported she felt very overwhelmed and stressed in the way testing was presented.    Patient is accompained by:  Family member   husband   Currently in Pain?  No/denies            ADULT SLP TREATMENT - 12/07/19 0001      General Information   Behavior/Cognition  Alert;Cooperative;Pleasant mood    Patient Positioning  Upright in chair    Oral care provided  N/A    HPI  This 78 y/o female presented to [name] on 09/15/2019 due to c/o not being able to verbalize correctly.Husband reports this has been worsening over the past 1-2 years.  Pt reported she often knows the word but can't get it out. She has no hx of head trauma, MRI revealed mild microvascular ischemic changes only, no significant ischemic events noted. Pt does report family hx of Alzheimer's disease. Pt struggles to perform tasks that she used to perform easily, ie checking  emails, text messages. Per her dtr's report when patient is asked to check her email, she may check her text messages instead. Pt struggles to remember names. Pt able to drive without making mistakes or getting lost. Pt stated she is somewhat depressed and has low motivation to do things she once enjoyed. Pt reports when she attempts to read or do crossword puzzles, she experiences blurry vision.  She is here seeking assessment and treatment of her increasingly worsening word finding difficulties.      Treatment Provided   Treatment provided  Cognitive-Linquistic      Cognitive-Linquistic Treatment   Treatment focused on  Cognition;Aphasia;Patient/family/caregiver education    Skilled Treatment   Pt named pictured objects given phonemic and/or phrase completion cues as well as verbal cues to describe pictured object w/semantic features when having word finding difficulty with 100% accuracy (85% accuracy independently.) Pt completed convergent naming tasks with 75% accuracy given mod verbal cues and divergent naming tasks (adding 1 more like category member) with 75% accuracy given mod verbal cues.       Assessment / Recommendations / Plan   Plan  Continue with current plan of care      Progression Toward Goals   Progression toward goals  Progressing toward goals  SLP Education - 12/07/19 1509    Education Details  re: compensatory strategies to aid word finding    Person(s) Educated  Patient;Spouse    Methods  Explanation;Demonstration;Verbal cues;Handout    Comprehension  Verbalized understanding;Need further instruction;Returned demonstration;Verbal cues required         SLP Long Term Goals - 11/05/19 1017      SLP LONG TERM GOAL #1   Title  Pt will name/describe objects/pictures with at least 3 semantic features independently.    Time  12    Period  Weeks    Status  New    Target Date  02/02/20      SLP LONG TERM GOAL #2   Title  Pt will answer wh questions re:  functional reading tasks with 90% accuracy independently.    Time  12    Period  Weeks    Status  New    Target Date  02/02/20      SLP LONG TERM GOAL #3   Title  Pt will accurately describe a pictured scene providing 3-5 detailed sentences with 80% accuracy given min verbal cues.    Time  12    Period  Weeks    Status  New    Target Date  02/02/20      SLP LONG TERM GOAL #4   Title  Pt will complete visual attention/vigilance/ memory tasks with 80% accuracy given min cues.    Time  12    Period  Weeks    Status  New    Target Date  02/02/20      SLP LONG TERM GOAL #5   Title  Pt will be independent for use of compensatory strategies to aid word finding and short term recall with 80% accuracy.    Time  12    Period  Weeks    Status  New    Target Date  02/02/20       Plan - 12/07/19 1510    Clinical Impression Statement  Improved fluency noted with confrontation naming this session, regular mod cues required to complete convergent and divergent naming tasks. Home practice instructions and materials provided to increase fluency and speed with confrontation naming. Pt will benefit from continued skilled ST tx to promote restoration of cognitive linguistic skills and provide pt education re: compensatory strategies to improve functional independence.   Speech Therapy Frequency  2x / week    Duration  Other (comment)   12 weeks   Treatment/Interventions  SLP instruction and feedback;Compensatory strategies;Functional tasks;Cognitive reorganization;Internal/external aids;Patient/family education;Cueing hierarchy    Potential to Achieve Goals  Good    Potential Considerations  Ability to learn/carryover information;Family/community support;Previous level of function;Cooperation/participation level    SLP Home Exercise Plan  Word finding tasks, home use of compensatory strategies to aid word finding    Consulted and Agree with Plan of Care  Patient;Family member/caregiver    Family  Member Consulted  husband       Patient will benefit from skilled therapeutic intervention in order to improve the following deficits and impairments:   Aphasia  Cognitive communication deficit    Problem List There are no problems to display for this patient.   [place], MA, CCC-SLP 12/07/2019, 3:18 PM  [place] MAIN [facility] SERVICES [address] [place], [place], [address] Phone: [phone]   Fax:  [phone]   Name: Chera Fladung MRN: [id number] Date of Birth: 1941-09-23

## 2019-12-10 ENCOUNTER — Encounter: Payer: Medicare Other | Admitting: Speech Pathology

## 2019-12-14 ENCOUNTER — Ambulatory Visit: Payer: Medicare Other | Admitting: Speech Pathology

## 2019-12-21 ENCOUNTER — Ambulatory Visit: Payer: Medicare Other | Admitting: Speech Pathology

## 2019-12-24 ENCOUNTER — Ambulatory Visit: Payer: Medicare Other | Admitting: Speech Pathology

## 2019-12-24 ENCOUNTER — Other Ambulatory Visit: Payer: Self-pay

## 2019-12-24 DIAGNOSIS — R4701 Aphasia: Secondary | ICD-10-CM | POA: Diagnosis not present

## 2019-12-24 DIAGNOSIS — R41841 Cognitive communication deficit: Secondary | ICD-10-CM

## 2019-12-24 NOTE — Therapy (Signed)
[place] MAIN [facility] SERVICES [address] [place], [place], [address] Phone: [phone]   Fax:  [phone]  Speech Language Pathology Treatment  Patient Details  Name: Allison Ramos MRN: [id number] Date of Birth: 11-30-1941 No data recorded  Encounter Date: 12/24/2019  End of Session - 12/24/19 1237    Visit Number  8    Number of Visits  25    Date for SLP Re-Evaluation  02/02/20    Authorization - Visit Number  7    Authorization - Number of Visits  10    SLP Start Time  1100    SLP Stop Time   [id number]    SLP Time Calculation (min)  58 min    Activity Tolerance  Patient tolerated treatment well       Past Medical History:  Diagnosis Date  . Hyperlipidemia   . Osteoarthrosis     Past Surgical History:  Procedure Laterality Date  . ABDOMINAL HYSTERECTOMY      There were no vitals filed for this visit.  Subjective Assessment - 12/24/19 1234    Subjective  Pt reported they had a good holiday and she had a great birthday with her daughter this past Tuesday.    Patient is accompained by:  Family member   husband   Currently in Pain?  No/denies            ADULT SLP TREATMENT - 12/24/19 0001      General Information   Behavior/Cognition  Alert;Cooperative;Pleasant mood    Patient Positioning  Upright in chair    Oral care provided  N/A    HPI  This 78 y/o female presented to [name] on 09/15/2019 due to c/o not being able to verbalize correctly.Husband reports this has been worsening over the past 1-2 years.  Pt reported she often knows the word but can't get it out. She has no hx of head trauma, MRI revealed mild microvascular ischemic changes only, no significant ischemic events noted. Pt does report family hx of Alzheimer's disease. Pt struggles to perform tasks that she used to perform easily, ie checking emails, text messages. Per her dtr's report when patient is asked to check her email, she may check her text  messages instead. Pt struggles to remember names. Pt able to drive without making mistakes or getting lost. Pt stated she is somewhat depressed and has low motivation to do things she once enjoyed. Pt reports when she attempts to read or do crossword puzzles, she experiences blurry vision.  She is here seeking assessment and treatment of her increasingly worsening word finding difficulties.      Treatment Provided   Treatment provided  Cognitive-Linquistic      Cognitive-Linquistic Treatment   Treatment focused on  Cognition;Aphasia;Patient/family/caregiver education    Skilled Treatment  Pt named pictured objects given phonemic and/or phrase completion cues as well as verbal cues to describe pictured object w/semantic features when having word finding difficulty with 90% accuracy (80% accuracy independently.)  Pt produced a sentence using named pictured object and provided at least one semantic feature with 80% accuracy given min to mod verbal cues.        Assessment / Recommendations / Plan   Plan  Continue with current plan of care      Progression Toward Goals   Progression toward goals  Progressing toward goals       SLP Education - 12/24/19 1236    Education Details  re: compensatory strategies  to aid word finding    Person(s) Educated  Patient;Spouse    Methods  Explanation;Demonstration;Verbal cues    Comprehension  Verbalized understanding;Need further instruction;Returned demonstration;Verbal cues required         SLP Long Term Goals - 11/05/19 1017      SLP LONG TERM GOAL #1   Title  Pt will name/describe objects/pictures with at least 3 semantic features independently.    Time  12    Period  Weeks    Status  New    Target Date  02/02/20      SLP LONG TERM GOAL #2   Title  Pt will answer wh questions re: functional reading tasks with 90% accuracy independently.    Time  12    Period  Weeks    Status  New    Target Date  02/02/20      SLP LONG TERM GOAL #3    Title  Pt will accurately describe a pictured scene providing 3-5 detailed sentences with 80% accuracy given min verbal cues.    Time  12    Period  Weeks    Status  New    Target Date  02/02/20      SLP LONG TERM GOAL #4   Title  Pt will complete visual attention/vigilance/ memory tasks with 80% accuracy given min cues.    Time  12    Period  Weeks    Status  New    Target Date  02/02/20      SLP LONG TERM GOAL #5   Title  Pt will be independent for use of compensatory strategies to aid word finding and short term recall with 80% accuracy.    Time  12    Period  Weeks    Status  New    Target Date  02/02/20       Plan - 12/24/19 1237    Clinical Impression Statement  Phrase completion and letter cues are most effective in aiding word finding when pt unable to cue herself thru use of compensatory strategies. Pt will benefit from continued skilled ST tx to promote restoration of cognitive linguistic skills and provide pt education re: compensatory strategies to improve functional independence.   Speech Therapy Frequency  2x / week    Duration  Other (comment)   12 weeks   Treatment/Interventions  SLP instruction and feedback;Compensatory strategies;Functional tasks;Cognitive reorganization;Internal/external aids;Patient/family education;Cueing hierarchy    Potential to Achieve Goals  Good    Potential Considerations  Ability to learn/carryover information;Family/community support;Previous level of function;Cooperation/participation level    SLP Home Exercise Plan  Word finding tasks, home use of compensatory strategies to aid word finding    Consulted and Agree with Plan of Care  Patient;Family member/caregiver    Family Member Consulted  husband       Patient will benefit from skilled therapeutic intervention in order to improve the following deficits and impairments:   Aphasia  Cognitive communication deficit    Problem List There are no problems to display for this  patient.   [place], MA, CCC-SLP 12/24/2019, 12:42 PM  [place] MAIN [facility] SERVICES [address] [place], [place], [address] Phone: [phone]   Fax:  [phone]   Name: Allison Ramos MRN: [id number] Date of Birth: 1941/11/17

## 2019-12-28 ENCOUNTER — Encounter: Payer: Medicare Other | Admitting: Speech Pathology

## 2019-12-31 ENCOUNTER — Encounter: Payer: Medicare Other | Admitting: Speech Pathology

## 2020-01-04 ENCOUNTER — Other Ambulatory Visit: Payer: Self-pay

## 2020-01-04 ENCOUNTER — Ambulatory Visit: Payer: Medicare Other | Admitting: Speech Pathology

## 2020-01-04 ENCOUNTER — Ambulatory Visit: Payer: Medicare Other | Attending: Neurology | Admitting: Speech Pathology

## 2020-01-04 DIAGNOSIS — R41841 Cognitive communication deficit: Secondary | ICD-10-CM

## 2020-01-04 DIAGNOSIS — R4701 Aphasia: Secondary | ICD-10-CM | POA: Diagnosis present

## 2020-01-04 NOTE — Therapy (Signed)
[place] MAIN [facility] SERVICES [address] [place], [place], [address] Phone: [phone]   Fax:  [phone]  Speech Language Pathology Treatment  Patient Details  Name: Allison Ramos MRN: [id number] Date of Birth: Mar 27, 1941 No data recorded  Encounter Date: 01/04/2020  End of Session - 01/04/20 1503    Visit Number  9    Number of Visits  25    Date for SLP Re-Evaluation  02/02/20    Authorization - Visit Number  8    Authorization - Number of Visits  10    SLP Start Time  [id number]    SLP Stop Time   1456    SLP Time Calculation (min)  62 min    Activity Tolerance  Patient tolerated treatment well       Past Medical History:  Diagnosis Date  . Hyperlipidemia   . Osteoarthrosis     Past Surgical History:  Procedure Laterality Date  . ABDOMINAL HYSTERECTOMY      There were no vitals filed for this visit.  Subjective Assessment - 01/04/20 1403    Subjective  Pt reported she got the COVID-19 vaccine a few days ago.    Patient is accompained by:  Family member   husband   Currently in Pain?  No/denies            ADULT SLP TREATMENT - 01/04/20 0001      General Information   Behavior/Cognition  Alert;Cooperative;Pleasant mood    Patient Positioning  Upright in chair    Oral care provided  N/A    HPI  This 79 y/o female presented to [name] on 09/15/2019 due to c/o not being able to verbalize correctly.Husband reports this has been worsening over the past 1-2 years.  Pt reported she often knows the word but can't get it out. She has no hx of head trauma, MRI revealed mild microvascular ischemic changes only, no significant ischemic events noted. Pt does report family hx of Alzheimer's disease. Pt struggles to perform tasks that she used to perform easily, ie checking emails, text messages. Per her dtr's report when patient is asked to check her email, she may check her text messages instead. Pt struggles to remember  names. Pt able to drive without making mistakes or getting lost. Pt stated she is somewhat depressed and has low motivation to do things she once enjoyed. Pt reports when she attempts to read or do crossword puzzles, she experiences blurry vision.  She is here seeking assessment and treatment of her increasingly worsening word finding difficulties.      Treatment Provided   Treatment provided  Cognitive-Linquistic      Cognitive-Linquistic Treatment   Treatment focused on  Cognition;Aphasia;Patient/family/caregiver education    Skilled Treatment  Pt named pictured objects given phonemic and/or phrase completion cues as well as verbal cues to describe pictured object w/semantic features when having word finding difficulty with 95% accuracy (80% accuracy independently.)  Pt named 5 words associated w/target word given mod verbal cues with 75% accuracy.  Pt produced a sentence using named associated words to describe target word with 80% accuracy given min to mod verbal cues.   Pt named words in a named category given initial letter with 75% accuracy given mod verbal cues.       Assessment / Recommendations / Plan   Plan  Continue with current plan of care      Progression Toward Goals   Progression toward goals  Progressing  toward goals       SLP Education - 01/04/20 1503    Education Details  re: compensatory strategies to aid word finding    Person(s) Educated  Patient;Spouse    Methods  Explanation;Demonstration;Verbal cues    Comprehension  Verbalized understanding;Need further instruction;Returned demonstration;Verbal cues required         SLP Long Term Goals - 11/05/19 1017      SLP LONG TERM GOAL #1   Title  Pt will name/describe objects/pictures with at least 3 semantic features independently.    Time  12    Period  Weeks    Status  New    Target Date  02/02/20      SLP LONG TERM GOAL #2   Title  Pt will answer wh questions re: functional reading tasks with 90%  accuracy independently.    Time  12    Period  Weeks    Status  New    Target Date  02/02/20      SLP LONG TERM GOAL #3   Title  Pt will accurately describe a pictured scene providing 3-5 detailed sentences with 80% accuracy given min verbal cues.    Time  12    Period  Weeks    Status  New    Target Date  02/02/20      SLP LONG TERM GOAL #4   Title  Pt will complete visual attention/vigilance/ memory tasks with 80% accuracy given min cues.    Time  12    Period  Weeks    Status  New    Target Date  02/02/20      SLP LONG TERM GOAL #5   Title  Pt will be independent for use of compensatory strategies to aid word finding and short term recall with 80% accuracy.    Time  12    Period  Weeks    Status  New    Target Date  02/02/20       Plan - 01/04/20 1504    Clinical Impression Statement Noted increased word finding difficulty and dysfluency this session, pt reports she wasn't feeling well last week, missed a tx session and did not practice with her home materials. Phrase completion and phonemic cues were most successful supporting word finding this session. Pt will benefit from continued skilled ST tx to promote restoration of cognitive linguistic skills and provide pt education re: compensatory strategies to improve functional independence.   Speech Therapy Frequency  2x / week    Duration  Other (comment)   12 weeks   Treatment/Interventions  SLP instruction and feedback;Compensatory strategies;Functional tasks;Cognitive reorganization;Internal/external aids;Patient/family education;Cueing hierarchy    Potential to Achieve Goals  Good    Potential Considerations  Ability to learn/carryover information;Family/community support;Previous level of function;Cooperation/participation level    SLP Home Exercise Plan  Word finding tasks, home use of compensatory strategies to aid word finding    Consulted and Agree with Plan of Care  Patient;Family member/caregiver    Family Member  Consulted  husband       Patient will benefit from skilled therapeutic intervention in order to improve the following deficits and impairments:   Aphasia  Cognitive communication deficit    Problem List There are no problems to display for this patient.   [place], MA, CCC-SLP 01/04/2020, 3:16 PM  [place] MAIN [facility] SERVICES [address] [place], [place], [address] Phone: [phone]   Fax:  [phone]   Name:  Allison Ramos MRN: [id number] Date of Birth: Oct 23, 1941

## 2020-01-07 ENCOUNTER — Encounter: Payer: Self-pay | Admitting: Speech Pathology

## 2020-01-07 ENCOUNTER — Other Ambulatory Visit: Payer: Self-pay

## 2020-01-07 ENCOUNTER — Ambulatory Visit: Payer: Medicare Other | Admitting: Speech Pathology

## 2020-01-07 DIAGNOSIS — R4701 Aphasia: Secondary | ICD-10-CM

## 2020-01-07 DIAGNOSIS — R41841 Cognitive communication deficit: Secondary | ICD-10-CM

## 2020-01-07 NOTE — Therapy (Signed)
[place] MAIN [facility] SERVICES [address] [place], [place], [address] Phone: [phone]   Fax:  [phone]  Speech Language Pathology Treatment  Patient Details  Name: Allison Ramos MRN: [id number] Date of Birth: 1941-04-03 No data recorded  Encounter Date: 01/07/2020  End of Session - 01/07/20 1010    Visit Number  10    Number of Visits  25    Date for SLP Re-Evaluation  02/02/20    Authorization - Visit Number  9    Authorization - Number of Visits  10    SLP Start Time  0900    SLP Stop Time   [id number]    SLP Time Calculation (min)  54 min    Activity Tolerance  Patient tolerated treatment well       Past Medical History:  Diagnosis Date  . Hyperlipidemia   . Osteoarthrosis     Past Surgical History:  Procedure Laterality Date  . ABDOMINAL HYSTERECTOMY      There were no vitals filed for this visit.  Subjective Assessment - 01/07/20 1008    Subjective  Pt apologized she forgot her homework materials today.    Patient is accompained by:  Family member   husband   Currently in Pain?  No/denies            ADULT SLP TREATMENT - 01/07/20 0001      General Information   Behavior/Cognition  Alert;Cooperative;Pleasant mood    Patient Positioning  Upright in chair    Oral care provided  N/A    HPI  This 79 y/o female presented to [name] on 09/15/2019 due to c/o not being able to verbalize correctly.Husband reports this has been worsening over the past 1-2 years.  Pt reported she often knows the word but can't get it out. She has no hx of head trauma, MRI revealed mild microvascular ischemic changes only, no significant ischemic events noted. Pt does report family hx of Alzheimer's disease. Pt struggles to perform tasks that she used to perform easily, ie checking emails, text messages. Per her dtr's report when patient is asked to check her email, she may check her text messages instead. Pt struggles to remember names.  Pt able to drive without making mistakes or getting lost. Pt stated she is somewhat depressed and has low motivation to do things she once enjoyed. Pt reports when she attempts to read or do crossword puzzles, she experiences blurry vision.  She is here seeking assessment and treatment of her increasingly worsening word finding difficulties.      Treatment Provided   Treatment provided  Cognitive-Linquistic      Cognitive-Linquistic Treatment   Treatment focused on  Cognition;Aphasia;Patient/family/caregiver education    Skilled Treatment Pt named pictured objects given phonemic, letter and/or phrase completion cues as well as verbal cues to describe pictured object w/semantic features when having word finding difficulty with 90% accuracy (80% accuracy independently.)  Pt named 3-5 semantic features given pictured objects including initial and final sound, rhymes with, function, shape, parts, etc.  for pictured item with 85% accuracy given min to mod verbal cues.      Assessment / Recommendations / Plan   Plan  Continue with current plan of care      Progression Toward Goals   Progression toward goals  Progressing toward goals       SLP Education - 01/07/20 1009    Education Details  re: compensatory strategies to aid word finding, how  to use rec'd tx app for home practice to facilitate word retrieval and improve expressive language fluency    Person(s) Educated  Patient;Spouse    Methods  Explanation;Demonstration;Verbal cues    Comprehension  Verbalized understanding;Need further instruction;Returned demonstration;Verbal cues required         SLP Long Term Goals - 11/05/19 1017      SLP LONG TERM GOAL #1   Title  Pt will name/describe objects/pictures with at least 3 semantic features independently.    Time  12    Period  Weeks    Status  New    Target Date  02/02/20      SLP LONG TERM GOAL #2   Title  Pt will answer wh questions re: functional reading tasks with 90%  accuracy independently.    Time  12    Period  Weeks    Status  New    Target Date  02/02/20      SLP LONG TERM GOAL #3   Title  Pt will accurately describe a pictured scene providing 3-5 detailed sentences with 80% accuracy given min verbal cues.    Time  12    Period  Weeks    Status  New    Target Date  02/02/20      SLP LONG TERM GOAL #4   Title  Pt will complete visual attention/vigilance/ memory tasks with 80% accuracy given min cues.    Time  12    Period  Weeks    Status  New    Target Date  02/02/20      SLP LONG TERM GOAL #5   Title  Pt will be independent for use of compensatory strategies to aid word finding and short term recall with 80% accuracy.    Time  12    Period  Weeks    Status  New    Target Date  02/02/20       Plan - 01/07/20 1010    Clinical Impression Statement  Educated pt and husband re: use and optimization of [employer]'s "Naming therapy" app for increased daily practice to facilitate improved word retrieval and fluency. Noted increased number of times pt independently cued herself thru description (SFA) and phrase completion to aid word finding.  Pt will benefit from continued skilled ST tx to promote restoration of cognitive linguistic skills and provide pt education re: compensatory strategies to improve functional independence.   Speech Therapy Frequency  2x / week    Duration  Other (comment)   12 weeks   Treatment/Interventions  SLP instruction and feedback;Compensatory strategies;Functional tasks;Cognitive reorganization;Internal/external aids;Patient/family education;Cueing hierarchy    Potential to Achieve Goals  Good    Potential Considerations  Ability to learn/carryover information;Family/community support;Previous level of function;Cooperation/participation level    SLP Home Exercise Plan  Word finding tasks, download and begin daily home practice with [employer]'s Language Therapy app, home use of compensatory strategies to aid  word finding    Consulted and Agree with Plan of Care  Patient;Family member/caregiver    Family Member Consulted  husband       Patient will benefit from skilled therapeutic intervention in order to improve the following deficits and impairments:   Aphasia  Cognitive communication deficit    Problem List There are no problems to display for this patient.   Tranae Laramie, MA, CCC-SLP 01/07/2020, 10:25 AM  [place] MAIN [facility] SERVICES [address] [place], [place], [address] Phone: [phone]  Fax:  [phone]   Name: Allison Ramos MRN: [id number] Date of Birth: [birth year]

## 2020-01-11 ENCOUNTER — Other Ambulatory Visit: Payer: Self-pay

## 2020-01-11 ENCOUNTER — Encounter: Payer: Self-pay | Admitting: Speech Pathology

## 2020-01-11 ENCOUNTER — Ambulatory Visit: Payer: Medicare Other | Admitting: Speech Pathology

## 2020-01-11 ENCOUNTER — Encounter: Payer: Medicare Other | Admitting: Speech Pathology

## 2020-01-11 DIAGNOSIS — R41841 Cognitive communication deficit: Secondary | ICD-10-CM

## 2020-01-11 DIAGNOSIS — R4701 Aphasia: Secondary | ICD-10-CM | POA: Diagnosis not present

## 2020-01-11 NOTE — Therapy (Signed)
[place] MAIN [facility] SERVICES [address] [place], [place], [address] Phone: [phone]   Fax:  [phone]  Speech Language Pathology Treatment/Progress Note  Patient Details  Name: Allison Ramos MRN: [id number] Date of Birth: [birth year] No data recorded  Encounter Date: 01/11/2020  End of Session - 01/11/20 1107    Visit Number  11    Number of Visits  25    Date for SLP Re-Evaluation  02/02/20    Authorization - Visit Number  10    Authorization - Number of Visits  10    SLP Start Time  1006   pt arrived a few min late to tx   SLP Stop Time   1100    SLP Time Calculation (min)  54 min    Activity Tolerance  Patient tolerated treatment well       Past Medical History:  Diagnosis Date  . Hyperlipidemia   . Osteoarthrosis     Past Surgical History:  Procedure Laterality Date  . ABDOMINAL HYSTERECTOMY      There were no vitals filed for this visit.  Subjective Assessment - 01/11/20 1104    Subjective  Pt apologized for forgetting to bring her homework folder to tx today, she got it mixed up with her PT folder. Husband reported he is working w/pt's daughter to get an ipad and get pt up and running w/ "naming therapy" from [place] therapy apps.    Patient is accompained by:  Family member   husband   Currently in Pain?  No/denies            ADULT SLP TREATMENT - 01/11/20 0001      General Information   Behavior/Cognition  Alert;Cooperative;Pleasant mood    Patient Positioning  Upright in chair    HPI  This 79 y/o female presented to [name] on 09/15/2019 due to c/o not being able to verbalize correctly.Husband reports this has been worsening over the past 1-2 years.  Pt reported she often knows the word but can't get it out. She has no hx of head trauma, MRI revealed mild microvascular ischemic changes only, no significant ischemic events noted. Pt does report family hx of Alzheimer's disease. Pt struggles to  perform tasks that she used to perform easily, ie checking emails, text messages. Per her dtr's report when patient is asked to check her email, she may check her text messages instead. Pt struggles to remember names. Pt able to drive without making mistakes or getting lost. Pt stated she is somewhat depressed and has low motivation to do things she once enjoyed. Pt reports when she attempts to read or do crossword puzzles, she experiences blurry vision.  She is here seeking assessment and treatment of her increasingly worsening word finding difficulties.      Treatment Provided   Treatment provided  Cognitive-Linquistic      Cognitive-Linquistic Treatment   Treatment focused on  Cognition;Aphasia;Patient/family/caregiver education    Skilled Treatment   Pt read mod complex paragraphs aloud and answered wh questions with 85% accuracy given ability to re-read passage to locate answers, 50% without written cues.  Pt completed short written sentences given FO:4 written choices with 80% accuracy independently.   Pt answered simple wh questions given phrase completion, phonemic, and semantic cues with 95% accuracy, 70% accuracy independently.       Assessment / Recommendations / Plan   Plan  Continue with current plan of care      Progression Toward  Goals   Progression toward goals  Progressing toward goals       SLP Education - 01/11/20 1106    Education Details  re: compensatory strategies to aid word finding, how to use rec'd tx app for home practice to facilitate word retrieval and improve expressive language fluency    Person(s) Educated  Patient;Spouse    Methods  Explanation;Demonstration;Verbal cues    Comprehension  Verbalized understanding;Returned demonstration;Verbal cues required;Need further instruction         SLP Long Term Goals - 01/11/20 1108      SLP LONG TERM GOAL #1   Title  Pt will name/describe objects/pictures with at least 3 semantic features independently.     Time  12    Period  Weeks    Status  On-going    Target Date  02/02/20      SLP LONG TERM GOAL #2   Title  Pt will answer wh questions re: functional reading tasks with 90% accuracy independently.    Time  12    Period  Weeks    Status  On-going    Target Date  02/02/20      SLP LONG TERM GOAL #3   Title  Pt will accurately describe a pictured scene providing 3-5 detailed sentences with 80% accuracy given min verbal cues.    Time  12    Period  Weeks    Status  On-going    Target Date  02/02/20      SLP LONG TERM GOAL #4   Title  Pt will complete visual attention/vigilance/ memory tasks with 80% accuracy given min cues.    Time  12    Period  Weeks    Status  On-going    Target Date  02/02/20      SLP LONG TERM GOAL #5   Title  Pt will be independent for use of compensatory strategies to aid word finding and short term recall with 80% accuracy.    Time  12    Period  Weeks    Status  On-going    Target Date  02/02/20       Plan - 01/11/20 1108    Clinical Impression Statement  Pt demonstrated increased short term recall difficulties, requiring repetition of previously stated information and decreased ability to self-cue when experiencing word finding difficulties. Pt stated she had not eaten breakfast yet today possibly affecting her performance in tx today. However noted improved word finding and fluency answering wh questions in comparison to last tx session, decreased frustration noted. Pt is making slow steady gains on all treatment goals. Pt has been unable to consistently attend tx 2x/week  due to numerous MD & Dentist appointments, holiday conflicts and intermittent illness (completed 10/19 scheduled tx sessions since evaluation on 11/02/19).  Will likely need a re-certification for an additional 12 weeks of tx when certification period is up on 02/02/20. Pt will benefit from continued skilled ST tx to promote restoration of cognitive linguistic skills and provide pt  education re: compensatory strategies to improve functional independence.   Speech Therapy Frequency  2x / week    Duration  Other (comment)   12 weeks   Treatment/Interventions  SLP instruction and feedback;Compensatory strategies;Functional tasks;Cognitive reorganization;Internal/external aids;Patient/family education;Cueing hierarchy    Potential to Achieve Goals  Good    Potential Considerations  Ability to learn/carryover information;Family/community support;Previous level of function;Cooperation/participation level    SLP Home Exercise Plan  Word finding tasks, download and begin daily  home practice with [employer]'s Language Therapy app, home use of compensatory strategies to aid word finding    Consulted and Agree with Plan of Care  Patient;Family member/caregiver    Family Member Consulted  husband       Patient will benefit from skilled therapeutic intervention in order to improve the following deficits and impairments:   Aphasia  Cognitive communication deficit    Problem List There are no problems to display for this patient.   Ila, MA, CCC-SLP 01/11/2020, 11:29 AM  [place] MAIN [facility] SERVICES [address] [place], [place], [address] Phone: [phone]   Fax:  [phone]   Name: Allison Ramos MRN: [id number] Date of Birth: 1941/10/10

## 2020-01-14 ENCOUNTER — Encounter: Payer: Medicare Other | Admitting: Speech Pathology

## 2020-01-18 ENCOUNTER — Other Ambulatory Visit: Payer: Self-pay

## 2020-01-18 ENCOUNTER — Encounter: Payer: Self-pay | Admitting: Speech Pathology

## 2020-01-18 ENCOUNTER — Ambulatory Visit: Payer: Medicare Other | Admitting: Speech Pathology

## 2020-01-18 DIAGNOSIS — R41841 Cognitive communication deficit: Secondary | ICD-10-CM

## 2020-01-18 DIAGNOSIS — R4701 Aphasia: Secondary | ICD-10-CM | POA: Diagnosis not present

## 2020-01-18 NOTE — Therapy (Signed)
[facility] SERVICES [address] [place], [place], [address] Phone: [phone]   Fax:  [phone]  Speech Language Pathology Treatment  Patient Details  Name: Allison Ramos MRN: [id number] Date of Birth: [birth year] No data recorded  Encounter Date: 01/18/2020  End of Session - 01/18/20 1212    Visit Number  12    Number of Visits  25    Date for SLP Re-Evaluation  02/02/20    Authorization - Visit Number  1    Authorization - Number of Visits  10    SLP Start Time  1107   pt was a few min late due to new location of waiting room   SLP Stop Time   1201    SLP Time Calculation (min)  54 min    Activity Tolerance  Patient tolerated treatment well       Past Medical History:  Diagnosis Date  . Hyperlipidemia   . Osteoarthrosis     Past Surgical History:  Procedure Laterality Date  . ABDOMINAL HYSTERECTOMY      There were no vitals filed for this visit.  Subjective Assessment - 01/18/20 1209    Subjective  Pt was very pleasant and cooperative, appeared to enjoy tx tasks, smiling often. Brought HEP w/her to tx today.    Patient is accompained by:  Family member   husband   Currently in Pain?  No/denies            ADULT SLP TREATMENT - 01/18/20 0001      General Information   Behavior/Cognition  Alert;Cooperative;Pleasant mood    Patient Positioning  Upright in chair    Oral care provided  N/A    HPI  This 79 y/o female presented to [name] on 09/15/2019 due to c/o not being able to verbalize correctly.Husband reports this has been worsening over the past 1-2 years.  Pt reported she often knows the word but can't get it out. She has no hx of head trauma, MRI revealed mild microvascular ischemic changes only, no significant ischemic events noted. Pt does report family hx of Alzheimer's disease. Pt struggles to perform tasks that she used to perform easily, ie checking emails, text messages. Per her dtr's  report when patient is asked to check her email, she may check her text messages instead. Pt struggles to remember names. Pt able to drive without making mistakes or getting lost. Pt stated she is somewhat depressed and has low motivation to do things she once enjoyed. Pt reports when she attempts to read or do crossword puzzles, she experiences blurry vision.  She is here seeking assessment and treatment of her increasingly worsening word finding difficulties.      Treatment Provided   Treatment provided  Cognitive-Linquistic      Cognitive-Linquistic Treatment   Treatment focused on  Cognition;Aphasia;Patient/family/caregiver education    Skilled Treatment   Pt named pictured objects given occasional verbal cues as necessary with 95% accuracy (90% accuracy independently.) Pt completed convergent naming tasks given min to mod verbal cues w/90% accuracy. Pt completed cognitive flexibility tasks, (ie, name days of week forward and backward) given mod verbal and visual cues as necessary with 100% accuracy, pt unable to perform without visual prompts.       Assessment / Recommendations / Plan   Plan  Continue with current plan of care      Progression Toward Goals   Progression toward goals  Progressing toward goals  SLP Education - 01/18/20 1211    Education Details  re: compensatory strategies to aid word finding, importantance of consistent home practice of HEP, journal of daily events to aid short term recall    Person(s) Educated  Patient;Spouse    Methods  Explanation;Demonstration;Verbal cues;Handout    Comprehension  Verbalized understanding;Need further instruction;Returned demonstration;Verbal cues required         SLP Long Term Goals - 01/11/20 1108      SLP LONG TERM GOAL #1   Title  Pt will name/describe objects/pictures with at least 3 semantic features independently.    Time  12    Period  Weeks    Status  On-going    Target Date  02/02/20      SLP LONG TERM  GOAL #2   Title  Pt will answer wh questions re: functional reading tasks with 90% accuracy independently.    Time  12    Period  Weeks    Status  On-going    Target Date  02/02/20      SLP LONG TERM GOAL #3   Title  Pt will accurately describe a pictured scene providing 3-5 detailed sentences with 80% accuracy given min verbal cues.    Time  12    Period  Weeks    Status  On-going    Target Date  02/02/20      SLP LONG TERM GOAL #4   Title  Pt will complete visual attention/vigilance/ memory tasks with 80% accuracy given min cues.    Time  12    Period  Weeks    Status  On-going    Target Date  02/02/20      SLP LONG TERM GOAL #5   Title  Pt will be independent for use of compensatory strategies to aid word finding and short term recall with 80% accuracy.    Time  12    Period  Weeks    Status  On-going    Target Date  02/02/20       Plan - 01/18/20 1213    Clinical Impression Statement  Noted improved speed and independence w/confrontation naming tasks this session. Written cues/prompts required to complete cognitive flexibility tasks w/accuracy. Discussed need for consistent home practice of HEP, strategies to use when overwhelmed or stuck, and rec'd use of daily journal to track daily events. Additional home materials provided. Pt will benefit from continued skilled ST tx to promote restoration of cognitive linguistic skills and provide pt education re: compensatory strategies to improve functional independence   Speech Therapy Frequency  2x / week    Duration  Other (comment)   12 weeks   Treatment/Interventions  SLP instruction and feedback;Compensatory strategies;Functional tasks;Cognitive reorganization;Internal/external aids;Patient/family education;Cueing hierarchy    Potential to Achieve Goals  Good    Potential Considerations  Ability to learn/carryover information;Family/community support;Previous level of function;Cooperation/participation level    SLP Home  Exercise Plan  Word finding tasks, download and begin daily home practice with [employer]'s Language Therapy app, home use of compensatory strategies to aid word finding    Consulted and Agree with Plan of Care  Patient;Family member/caregiver    Family Member Consulted  husband       Patient will benefit from skilled therapeutic intervention in order to improve the following deficits and impairments:   Aphasia  Cognitive communication deficit    Problem List There are no problems to display for this patient.   Vontrell Pullman, MA, CCC-SLP 01/18/2020, 12:31 PM  [place] MAIN [facility] SERVICES [address] [place], [place], [address] Phone: [phone]   Fax:  [phone]   Name: Laurena Fenter MRN: [id number] Date of Birth: [birth year]

## 2020-01-21 ENCOUNTER — Ambulatory Visit: Payer: Medicare Other | Admitting: Speech Pathology

## 2020-01-25 ENCOUNTER — Ambulatory Visit: Payer: Medicare Other | Attending: Neurology | Admitting: Speech Pathology

## 2020-01-25 ENCOUNTER — Encounter: Payer: Self-pay | Admitting: Speech Pathology

## 2020-01-25 ENCOUNTER — Other Ambulatory Visit: Payer: Self-pay

## 2020-01-25 DIAGNOSIS — R41841 Cognitive communication deficit: Secondary | ICD-10-CM | POA: Diagnosis present

## 2020-01-25 DIAGNOSIS — R4701 Aphasia: Secondary | ICD-10-CM | POA: Insufficient documentation

## 2020-01-25 NOTE — Therapy (Signed)
[place] MAIN [facility] SERVICES [address] [place], [place], [address] Phone: [phone]   Fax:  [phone]  Speech Language Pathology Treatment  Patient Details  Name: Shaunti Battenfield MRN: [id number] Date of Birth: [birth year] No data recorded  Encounter Date: 01/25/2020  End of Session - 01/25/20 1613    Visit Number  13    Number of Visits  25    Date for SLP Re-Evaluation  02/02/20    Authorization - Visit Number  2    Authorization - Number of Visits  10    SLP Start Time  [id number]   pt apologized for being late, was coming from another appt   SLP Stop Time   1400    SLP Time Calculation (min)  52 min    Activity Tolerance  Patient tolerated treatment well       Past Medical History:  Diagnosis Date  . Hyperlipidemia   . Osteoarthrosis     Past Surgical History:  Procedure Laterality Date  . ABDOMINAL HYSTERECTOMY      There were no vitals filed for this visit.  Subjective Assessment - 01/25/20 1610    Subjective  Pt reported she was very happy to get her second dose of the vaccine this past Friday.    Patient is accompained by:  Family member   husband   Currently in Pain?  No/denies            ADULT SLP TREATMENT - 01/25/20 0001      General Information   Behavior/Cognition  Alert;Cooperative;Pleasant mood    Patient Positioning  Upright in chair    Oral care provided  N/A    HPI  This 79 y/o female presented to [name] on 09/15/2019 due to c/o not being able to verbalize correctly.Husband reports this has been worsening over the past 1-2 years.  Pt reported she often knows the word but can't get it out. She has no hx of head trauma, MRI revealed mild microvascular ischemic changes only, no significant ischemic events noted. Pt does report family hx of Alzheimer's disease. Pt struggles to perform tasks that she used to perform easily, ie checking emails, text messages. Per her dtr's report when patient is asked  to check her email, she may check her text messages instead. Pt struggles to remember names. Pt able to drive without making mistakes or getting lost. Pt stated she is somewhat depressed and has low motivation to do things she once enjoyed. Pt reports when she attempts to read or do crossword puzzles, she experiences blurry vision.  She is here seeking assessment and treatment of her increasingly worsening word finding difficulties.      Treatment Provided   Treatment provided  Cognitive-Linquistic      Cognitive-Linquistic Treatment   Treatment focused on  Cognition;Aphasia;Patient/family/caregiver education    Skilled Treatment   Pt performed divided attention tasks given min to mod verbal cues with 100% accuracy. Pt completed written fill in the blank sentences with an appropriate noun/verb given min to mod verbal cues. Pt named 3-5 semantic features given pictured objects including naming category for pictured item with 80% accuracy given regular mod verbal cues.       Assessment / Recommendations / Plan   Plan  Continue with current plan of care      Progression Toward Goals   Progression toward goals  Progressing toward goals       SLP Education - 01/25/20 1612  Education Details  re: compensatory strategies to aid word finding    Person(s) Educated  Patient;Spouse    Methods  Explanation;Demonstration;Verbal cues    Comprehension  Verbalized understanding;Need further instruction;Returned demonstration;Verbal cues required         SLP Long Term Goals - 01/11/20 1108      SLP LONG TERM GOAL #1   Title  Pt will name/describe objects/pictures with at least 3 semantic features independently.    Time  12    Period  Weeks    Status  On-going    Target Date  02/02/20      SLP LONG TERM GOAL #2   Title  Pt will answer wh questions re: functional reading tasks with 90% accuracy independently.    Time  12    Period  Weeks    Status  On-going    Target Date  02/02/20       SLP LONG TERM GOAL #3   Title  Pt will accurately describe a pictured scene providing 3-5 detailed sentences with 80% accuracy given min verbal cues.    Time  12    Period  Weeks    Status  On-going    Target Date  02/02/20      SLP LONG TERM GOAL #4   Title  Pt will complete visual attention/vigilance/ memory tasks with 80% accuracy given min cues.    Time  12    Period  Weeks    Status  On-going    Target Date  02/02/20      SLP LONG TERM GOAL #5   Title  Pt will be independent for use of compensatory strategies to aid word finding and short term recall with 80% accuracy.    Time  12    Period  Weeks    Status  On-going    Target Date  02/02/20       Plan - 01/25/20 1614    Clinical Impression Statement  Pt apologized for not working on her HEP since last session, she reported they have been very busy w/appointments. Noted increased word finding difficulty when completing sentences when the target word was in the middle of the sentence (often verbs vs nouns) and increased independence and accuracy when completing the final nouns of the sentence. Pt will benefit from continued skilled ST tx to promote restoration of cognitive linguistic skills and provide pt education re: compensatory strategies to improve functional independence.    Speech Therapy Frequency  2x / week    Duration  Other (comment)   12 weeks   Treatment/Interventions  SLP instruction and feedback;Compensatory strategies;Functional tasks;Cognitive reorganization;Internal/external aids;Patient/family education;Cueing hierarchy    Potential to Achieve Goals  Good    Potential Considerations  Ability to learn/carryover information;Family/community support;Previous level of function;Cooperation/participation level    SLP Home Exercise Plan  Word finding tasks provided, download and begin daily home practice with [employer]'s Language Therapy app, home use of compensatory strategies to aid word finding    Consulted  and Agree with Plan of Care  Patient;Family member/caregiver    Family Member Consulted  husband       Patient will benefit from skilled therapeutic intervention in order to improve the following deficits and impairments:   Aphasia  Cognitive communication deficit    Problem List There are no problems to display for this patient.   Estelle Skibicki, MA, CCC-SLP 01/25/2020, 4:25 PM  [place] [facility] [place],  [place], [address] Phone: [phone]   Fax:  [phone]   Name: Cheynne Menasco MRN: [id number] Date of Birth: [birth year]

## 2020-01-28 ENCOUNTER — Ambulatory Visit: Payer: Medicare Other | Admitting: Speech Pathology

## 2020-02-01 ENCOUNTER — Other Ambulatory Visit: Payer: Self-pay

## 2020-02-01 ENCOUNTER — Ambulatory Visit: Payer: Medicare Other | Admitting: Speech Pathology

## 2020-02-01 DIAGNOSIS — R4701 Aphasia: Secondary | ICD-10-CM | POA: Diagnosis not present

## 2020-02-01 DIAGNOSIS — R41841 Cognitive communication deficit: Secondary | ICD-10-CM

## 2020-02-01 NOTE — Therapy (Signed)
[place] MAIN [facility] SERVICES [address] [place], [place], [address] Phone: [phone]   Fax:  [phone]  Speech Language Pathology Treatment  Patient Details  Name: Allison Ramos MRN: [id number] Date of Birth: Mar 01, 1941 No data recorded  Encounter Date: 02/01/2020  End of Session - 02/01/20 1642    Visit Number  14    Number of Visits  25    Date for SLP Re-Evaluation  02/02/20    Authorization - Visit Number  3    Authorization - Number of Visits  10    SLP Start Time  1300    SLP Stop Time   [id number]    SLP Time Calculation (min)  54 min    Activity Tolerance  Patient tolerated treatment well       Past Medical History:  Diagnosis Date  . Hyperlipidemia   . Osteoarthrosis     Past Surgical History:  Procedure Laterality Date  . ABDOMINAL HYSTERECTOMY      There were no vitals filed for this visit.  Subjective Assessment - 02/01/20 1305    Subjective  Pt reported she was doing well today, but apologized for not completing any of her HEP, reporting she had her 40 year old grandson over for the weekend.    Patient is accompained by:  Family member   husband   Currently in Pain?  No/denies            ADULT SLP TREATMENT - 02/01/20 0001      General Information   Behavior/Cognition  Alert;Cooperative;Pleasant mood    Patient Positioning  Upright in chair    Oral care provided  N/A    HPI  This 79 y/o female presented to [name] on 09/15/2019 due to c/o not being able to verbalize correctly.Husband reports this has been worsening over the past 1-2 years.  Pt reported she often knows the word but can't get it out. She has no hx of head trauma, MRI revealed mild microvascular ischemic changes only, no significant ischemic events noted. Pt does report family hx of Alzheimer's disease. Pt struggles to perform tasks that she used to perform easily, ie checking emails, text messages. Per her dtr's report when patient is  asked to check her email, she may check her text messages instead. Pt struggles to remember names. Pt able to drive without making mistakes or getting lost. Pt stated she is somewhat depressed and has low motivation to do things she once enjoyed. Pt reports when she attempts to read or do crossword puzzles, she experiences blurry vision.  She is here seeking assessment and treatment of her increasingly worsening word finding difficulties.      Treatment Provided   Treatment provided  Cognitive-Linquistic      Cognitive-Linquistic Treatment   Treatment focused on  Cognition;Aphasia;Patient/family/caregiver education    Skilled Treatment   Pt reworded illogical sentences with an appropriate word with 90% accuracy given min verbal cues.  Pt produced synonyms given target words in sentences with 80% accuracy given min to mod verbal cues. Pt named pictured objects given min verbal cues as necessary with 100% accuracy (80% accuracy independently.) Pt followed 1 step written directions to complete written tasks given intermittent verbal cues for attention to task with 100% accuracy. Pt appeared to shut down and overwhelmed, when SLP covered up one of the directions to see if she could recall it before completing it. All trials completed with written directions visible. Pt read mod complex  4-5 sentence paragraphs aloud and answered wh questions with 90% accuracy given ability to re-read passage to locate answers, 70% without written cues.      Assessment / Recommendations / Plan   Plan  Continue with current plan of care      Progression Toward Goals   Progression toward goals  Progressing toward goals       SLP Education - 02/01/20 1642    Education Details  re: compensatory strategies to aid word finding, HEP    Person(s) Educated  Patient;Spouse    Methods  Explanation;Demonstration;Verbal cues;Handout    Comprehension  Verbalized understanding;Need further instruction;Returned  demonstration;Verbal cues required         SLP Long Term Goals - 01/11/20 1108      SLP LONG TERM GOAL #1   Title  Pt will name/describe objects/pictures with at least 3 semantic features independently.    Time  12    Period  Weeks    Status  On-going    Target Date  02/02/20      SLP LONG TERM GOAL #2   Title  Pt will answer wh questions re: functional reading tasks with 90% accuracy independently.    Time  12    Period  Weeks    Status  On-going    Target Date  02/02/20      SLP LONG TERM GOAL #3   Title  Pt will accurately describe a pictured scene providing 3-5 detailed sentences with 80% accuracy given min verbal cues.    Time  12    Period  Weeks    Status  On-going    Target Date  02/02/20      SLP LONG TERM GOAL #4   Title  Pt will complete visual attention/vigilance/ memory tasks with 80% accuracy given min cues.    Time  12    Period  Weeks    Status  On-going    Target Date  02/02/20      SLP LONG TERM GOAL #5   Title  Pt will be independent for use of compensatory strategies to aid word finding and short term recall with 80% accuracy.    Time  12    Period  Weeks    Status  On-going    Target Date  02/02/20       Plan - 02/01/20 1643    Clinical Impression Statement  Pt apologized for not working on her HEP since last session, she reported they have been very busy w/her grandson. Noted improved fluency rewording illogical sentences, but increased difficulty naming synonyms for common nouns/verbs. Noted marked increased stress and pt shut down when SLP covered up a simple 1 step written direction to assess if pt could recall it. Pt will benefit from continued skilled ST tx to promote restoration of cognitive linguistic skills and provide pt education re: compensatory strategies to improve functional independence.   Speech Therapy Frequency  2x / week    Duration  Other (comment)   12 weeks   Treatment/Interventions  SLP instruction and  feedback;Compensatory strategies;Functional tasks;Cognitive reorganization;Internal/external aids;Patient/family education;Cueing hierarchy    Potential to Achieve Goals  Good    Potential Considerations  Ability to learn/carryover information;Family/community support;Previous level of function;Cooperation/participation level    SLP Home Exercise Plan  Word finding tasks provided, download and begin daily home practice with [employer]'s Language Therapy app, home use of compensatory strategies to aid word finding    Consulted and Agree with Plan of  Care  Patient;Family member/caregiver    Family Member Consulted  husband       Patient will benefit from skilled therapeutic intervention in order to improve the following deficits and impairments:   Aphasia  Cognitive communication deficit    Problem List There are no problems to display for this patient.   Koppel, MA, CCC-SLP 02/01/2020, 5:02 PM  [place] MAIN [facility] SERVICES [address] [place], [place], [address] Phone: [phone]   Fax:  [phone]   Name: Allison Ramos MRN: [id number] Date of Birth: Aug 08, 1941

## 2020-02-04 ENCOUNTER — Ambulatory Visit: Payer: Medicare Other | Admitting: Speech Pathology

## 2020-02-08 ENCOUNTER — Encounter: Payer: Self-pay | Admitting: Speech Pathology

## 2020-02-08 ENCOUNTER — Ambulatory Visit: Payer: Medicare Other | Admitting: Speech Pathology

## 2020-02-08 ENCOUNTER — Other Ambulatory Visit: Payer: Self-pay

## 2020-02-08 DIAGNOSIS — R4701 Aphasia: Secondary | ICD-10-CM

## 2020-02-08 DIAGNOSIS — R41841 Cognitive communication deficit: Secondary | ICD-10-CM

## 2020-02-08 NOTE — Therapy (Signed)
[place] MAIN [facility] SERVICES [address] [place], [place], [address] Phone: [phone]   Fax:  [phone]  Speech Language Pathology Treatment  Patient Details  Name: Allison Ramos MRN: [id number] Date of Birth: [birth year] No data recorded  Encounter Date: 02/08/2020  End of Session - 02/08/20 1702    Visit Number  15    Number of Visits  25    Date for SLP Re-Evaluation  04/01/20    Authorization - Visit Number  4    Authorization - Number of Visits  10    SLP Start Time  5726    SLP Stop Time   1400    SLP Time Calculation (min)  52 min    Activity Tolerance  Patient tolerated treatment well       Past Medical History:  Diagnosis Date  . Hyperlipidemia   . Osteoarthrosis     Past Surgical History:  Procedure Laterality Date  . ABDOMINAL HYSTERECTOMY      There were no vitals filed for this visit.  Subjective Assessment - 02/08/20 1317    Subjective  Pt reported she is doing well today and reporting she worked on her HEP.    Patient is accompained by:  Family member   husband   Currently in Pain?  No/denies            ADULT SLP TREATMENT - 02/08/20 0001      General Information   Behavior/Cognition  Alert;Cooperative;Pleasant mood    Patient Positioning  Upright in chair    Oral care provided  N/A    HPI  This 79 y/o female presented to [name] on 09/15/2019 due to c/o not being able to verbalize correctly.Husband reports this has been worsening over the past 1-2 years.  Pt reported she often knows the word but can't get it out. She has no hx of head trauma, MRI revealed mild microvascular ischemic changes only, no significant ischemic events noted. Pt does report family hx of Alzheimer's disease. Pt struggles to perform tasks that she used to perform easily, ie checking emails, text messages. Per her dtr's report when patient is asked to check her email, she may check her text messages instead. Pt struggles  to remember names. Pt able to drive without making mistakes or getting lost. Pt stated she is somewhat depressed and has low motivation to do things she once enjoyed. Pt reports when she attempts to read or do crossword puzzles, she experiences blurry vision.  She is here seeking assessment and treatment of her increasingly worsening word finding difficulties.      Cognitive-Linquistic Treatment   Treatment focused on  Cognition;Aphasia;Patient/family/caregiver education    Skilled Treatment  Pt named 3-5 semantic features to describe pictured object/word to enable SLP to guess target word with 90% accuracy given min to mod verbal cues for SFA. Pt reworded illogical sentences with an appropriate word with 90% accuracy given min verbal cues.  Pt produced synonyms given target words in sentences with 80% accuracy given min to mod verbal cues and antonyms with 95% accuracy given min cues.       Assessment / Recommendations / Plan   Plan  Continue with current plan of care      Progression Toward Goals   Progression toward goals  Progressing toward goals       SLP Education - 02/08/20 1701    Education Details  re: compensatory strategies to aid word finding, HEP  Person(s) Educated  Patient;Spouse    Methods  Explanation;Demonstration;Verbal cues    Comprehension  Verbalized understanding;Need further instruction;Returned demonstration;Verbal cues required         SLP Long Term Goals - 02/08/20 1718      SLP LONG TERM GOAL #1   Title  Pt will name/describe objects/pictures with at least 3 semantic features independently.    Time  8    Period  Weeks    Status  Partially Met    Target Date  04/01/20      SLP LONG TERM GOAL #2   Title  Pt will answer wh questions re: functional reading tasks with 90% accuracy independently.    Time  8    Period  Weeks    Status  On-going    Target Date  04/01/20      SLP LONG TERM GOAL #3   Title  Pt will accurately describe a pictured scene  providing 3-5 detailed sentences with 80% accuracy independently.    Time  8    Period  Weeks    Status  Revised    Target Date  04/01/20      SLP LONG TERM GOAL #4   Title  Pt will complete visual attention/vigilance/ memory tasks with 80% accuracy given min cues.    Time  8    Period  Weeks    Status  On-going    Target Date  04/01/20      SLP LONG TERM GOAL #5   Title  Pt will be independent for use of compensatory strategies to aid word finding and short term recall with 80% accuracy.    Time  8    Period  Weeks    Status  On-going    Target Date  04/01/20       Plan - 02/08/20 1702    Clinical Impression Statement  Pt demonstrated improved confidence as trials progressed using SFA , as seen in improved level of accuracy and decreased level of cueing. As noted in last tx session, Pt is making slow steady gains on all treatment goals. Pt has been unable to consistently attend tx 2x/week  due to numerous MD & Dentist appointments, holiday conflicts and intermittent illness (completed 10/19 scheduled tx sessions since evaluation on 11/02/19).  Will submit for re-certification for an additional 8 weeks of tx to meet pt needs and tx goals. Pt will benefit from continued skilled ST tx to promote restoration of cognitive linguistic skills and provide pt education re: compensatory strategies to improve functional independence.   Speech Therapy Frequency  2x / week    Duration  Other (comment)   8 weeks   Treatment/Interventions  SLP instruction and feedback;Compensatory strategies;Functional tasks;Cognitive reorganization;Internal/external aids;Patient/family education;Cueing hierarchy    Potential to Achieve Goals  Good    Potential Considerations  Ability to learn/carryover information;Family/community support;Previous level of function;Cooperation/participation level    SLP Home Exercise Plan  Word finding tasks provided, download and begin daily home practice with [employer]'s  Language Therapy app, home use of compensatory strategies to aid word finding    Consulted and Agree with Plan of Care  Patient;Family member/caregiver    Family Member Consulted  husband       Patient will benefit from skilled therapeutic intervention in order to improve the following deficits and impairments:   Aphasia  Cognitive communication deficit    Problem List There are no problems to display for this patient.   Dontrez Pettis, Eunice, CCC-SLP 02/08/2020,  5:23 PM  [place] MAIN [facility] SERVICES [address] [place], [place], [address] Phone: [phone]   Fax:  [phone]   Name: Allison Ramos MRN: [id number] Date of Birth: 1941/05/14

## 2020-02-11 ENCOUNTER — Ambulatory Visit: Payer: Medicare Other | Admitting: Speech Pathology

## 2020-02-15 ENCOUNTER — Other Ambulatory Visit: Payer: Self-pay

## 2020-02-15 ENCOUNTER — Ambulatory Visit: Payer: Medicare Other | Admitting: Speech Pathology

## 2020-02-15 ENCOUNTER — Encounter: Payer: Self-pay | Admitting: Speech Pathology

## 2020-02-15 DIAGNOSIS — R4701 Aphasia: Secondary | ICD-10-CM

## 2020-02-15 DIAGNOSIS — R41841 Cognitive communication deficit: Secondary | ICD-10-CM

## 2020-02-15 NOTE — Therapy (Signed)
[place] MAIN [facility] SERVICES [address] [place], [place], [address] Phone: [phone]   Fax:  [phone]  Speech Language Pathology Treatment  Patient Details  Name: Allison Ramos MRN: [id number] Date of Birth: May 08, 1941 No data recorded  Encounter Date: 02/15/2020  End of Session - 02/15/20 1608    Visit Number  16    Number of Visits  25    Date for SLP Re-Evaluation  04/01/20    Authorization - Visit Number  5    Authorization - Number of Visits  10    SLP Start Time  4656    SLP Stop Time   8127    SLP Time Calculation (min)  60 min    Activity Tolerance  Patient tolerated treatment well       Past Medical History:  Diagnosis Date  . Hyperlipidemia   . Osteoarthrosis     Past Surgical History:  Procedure Laterality Date  . ABDOMINAL HYSTERECTOMY      There were no vitals filed for this visit.  Subjective Assessment - 02/15/20 1605    Subjective  Pt reported she is doing well and brought her HEP with her to tx today    Patient is accompained by:  Family member   husband   Currently in Pain?  No/denies            ADULT SLP TREATMENT - 02/15/20 0001      General Information   Behavior/Cognition  Alert;Cooperative;Pleasant mood    Patient Positioning  Upright in chair    Oral care provided  N/A    HPI  This 79 y/o female presented to [name] on 09/15/2019 due to c/o not being able to verbalize correctly.Husband reports this has been worsening over the past 1-2 years.  Pt reported she often knows the word but can't get it out. She has no hx of head trauma, MRI revealed mild microvascular ischemic changes only, no significant ischemic events noted. Pt does report family hx of Alzheimer's disease. Pt struggles to perform tasks that she used to perform easily, ie checking emails, text messages. Per her dtr's report when patient is asked to check her email, she may check her text messages instead. Pt struggles to  remember names. Pt able to drive without making mistakes or getting lost. Pt stated she is somewhat depressed and has low motivation to do things she once enjoyed. Pt reports when she attempts to read or do crossword puzzles, she experiences blurry vision.  She is here seeking assessment and treatment of her increasingly worsening word finding difficulties.      Treatment Provided   Treatment provided  Cognitive-Linquistic      Cognitive-Linquistic Treatment   Treatment focused on  Cognition;Aphasia;Patient/family/caregiver education    Skilled Treatment   Pt named pictured objects with 95% accuracy given min verbal cues, and 85% independently. Attempted reading task without success; pt able to read short paragraph aloud; however pt c/o of visual disturbances making any additional related tasks, ie wh questions too difficult to focus on. Pt named 3-5 semantic features to describe pictured object/word/scene to enable SLP to guess target word with 90% accuracy given mod to max verbal cues for SFA.      Assessment / Recommendations / Plan   Plan  Continue with current plan of care      Progression Toward Goals   Progression toward goals  Progressing toward goals       SLP Education - 02/15/20  [id number]    Education Details  re: compensatory strategies to aid word finding HEP    Person(s) Educated  Patient;Spouse    Methods  Explanation;Demonstration;Verbal cues    Comprehension  Verbalized understanding;Need further instruction;Verbal cues required;Returned demonstration         SLP Long Term Goals - 02/08/20 1718      SLP LONG TERM GOAL #1   Title  Pt will name/describe objects/pictures with at least 3 semantic features independently.    Time  8    Period  Weeks    Status  Partially Met    Target Date  04/01/20      SLP LONG TERM GOAL #2   Title  Pt will answer wh questions re: functional reading tasks with 90% accuracy independently.    Time  8    Period  Weeks    Status   On-going    Target Date  04/01/20      SLP LONG TERM GOAL #3   Title  Pt will accurately describe a pictured scene providing 3-5 detailed sentences with 80% accuracy independently.    Time  8    Period  Weeks    Status  Revised    Target Date  04/01/20      SLP LONG TERM GOAL #4   Title  Pt will complete visual attention/vigilance/ memory tasks with 80% accuracy given min cues.    Time  8    Period  Weeks    Status  On-going    Target Date  04/01/20      SLP LONG TERM GOAL #5   Title  Pt will be independent for use of compensatory strategies to aid word finding and short term recall with 80% accuracy.    Time  8    Period  Weeks    Status  On-going    Target Date  04/01/20       Plan - 02/15/20 1609    Clinical Impression Statement  Pt demonstrated increased word finding difficulty this session. She demonstrated increased difficulty answering simple wh questions throughout tx, and she required marked increased level of cueing to describe objects in comparison to previous sessions. However she did demonstrate improved fluency and speed w/confrontation naming. Pt & husband reported they just got an iPad and are getting it set up so pt can complete HEP activities on it for consistent home practice. Pt will benefit from continued skilled ST tx to promote restoration of cognitive linguistic skills and provide pt education re: compensatory strategies to improve functional independence.   Speech Therapy Frequency  2x / week    Duration  Other (comment)   8 weeks   Treatment/Interventions  SLP instruction and feedback;Compensatory strategies;Functional tasks;Cognitive reorganization;Internal/external aids;Patient/family education;Cueing hierarchy    Potential to Achieve Goals  Good    Potential Considerations  Ability to learn/carryover information;Family/community support;Previous level of function;Cooperation/participation level    SLP Home Exercise Plan  Word finding tasks provided,  download and begin daily home practice with [employer]'s Language Therapy app, home use of compensatory strategies to aid word finding    Consulted and Agree with Plan of Care  Patient;Family member/caregiver    Family Member Consulted  husband       Patient will benefit from skilled therapeutic intervention in order to improve the following deficits and impairments:   Aphasia  Cognitive communication deficit    Problem List There are no problems to display for this patient.   Bailynn Dyk, MA, CCC-SLP  02/15/2020, 4:23 PM  [place] MAIN [facility] SERVICES [address] [place], [place], [address] Phone: [phone]   Fax:  [phone]   Name: Allison Ramos MRN: [id number] Date of Birth: [birth year]

## 2020-02-18 ENCOUNTER — Ambulatory Visit: Payer: Medicare Other | Admitting: Speech Pathology

## 2020-02-18 ENCOUNTER — Other Ambulatory Visit: Payer: Self-pay

## 2020-02-18 DIAGNOSIS — R4701 Aphasia: Secondary | ICD-10-CM

## 2020-02-18 DIAGNOSIS — R41841 Cognitive communication deficit: Secondary | ICD-10-CM

## 2020-02-18 NOTE — Therapy (Signed)
[place] MAIN [facility] SERVICES [address] [place], [place], [address] Phone: [phone]   Fax:  [phone]  Speech Language Pathology Treatment  Patient Details  Name: Allison Ramos MRN: [id number] Date of Birth: 15-Mar-1941 No data recorded  Encounter Date: 02/18/2020  End of Session - 02/18/20 1609    Visit Number  17    Number of Visits  25    Date for SLP Re-Evaluation  04/01/20    Authorization - Visit Number  6    Authorization - Number of Visits  10    SLP Start Time  1500    SLP Stop Time   1600    SLP Time Calculation (min)  60 min    Activity Tolerance  Patient tolerated treatment well       Past Medical History:  Diagnosis Date  . Hyperlipidemia   . Osteoarthrosis     Past Surgical History:  Procedure Laterality Date  . ABDOMINAL HYSTERECTOMY      There were no vitals filed for this visit.  Subjective Assessment - 02/18/20 1605    Subjective  Pt reported she is worked hard in physical therapy today.    Patient is accompained by:  Family member   husband   Currently in Pain?  No/denies            ADULT SLP TREATMENT - 02/18/20 0001      General Information   Behavior/Cognition  Alert;Cooperative;Pleasant mood    Patient Positioning  Upright in chair    Oral care provided  N/A    HPI  This 79 y/o female presented to [name] on 09/15/2019 due to c/o not being able to verbalize correctly.Husband reports this has been worsening over the past 1-2 years.  Pt reported she often knows the word but can't get it out. She has no hx of head trauma, MRI revealed mild microvascular ischemic changes only, no significant ischemic events noted. Pt does report family hx of Alzheimer's disease. Pt struggles to perform tasks that she used to perform easily, ie checking emails, text messages. Per her dtr's report when patient is asked to check her email, she may check her text messages instead. Pt struggles to remember  names. Pt able to drive without making mistakes or getting lost. Pt stated she is somewhat depressed and has low motivation to do things she once enjoyed. Pt reports when she attempts to read or do crossword puzzles, she experiences blurry vision.  She is here seeking assessment and treatment of her increasingly worsening word finding difficulties.      Treatment Provided   Treatment provided  Cognitive-Linquistic      Cognitive-Linquistic Treatment   Treatment focused on  Cognition;Aphasia;Patient/family/caregiver education    Skilled Treatment   Pt named pictured objects with 90% independently.  Pt described pictured scenes providing 3-5 details given mod verbal cues with 90% accuracy.  Pt named similarities and differences between pictured objects given mod verbal cues with 80% accuracy.  Educated pt and husband re: use and optimization of [employer]'s "Naming therapy, Advanced Naming therapy & Advanced Comprehension" app for increased daily practice to facilitate improved word retrieval and fluency.      Assessment / Recommendations / Plan   Plan  Continue with current plan of care      Progression Toward Goals   Progression toward goals  Progressing toward goals       SLP Education - 02/18/20 1608    Education Details  re: compensatory strategies to aid word finding, HEP    Person(s) Educated  Patient;Spouse    Methods  Explanation;Demonstration;Verbal cues    Comprehension  Verbalized understanding;Need further instruction;Returned demonstration;Verbal cues required         SLP Long Term Goals - 02/08/20 1718      SLP LONG TERM GOAL #1   Title  Pt will name/describe objects/pictures with at least 3 semantic features independently.    Time  8    Period  Weeks    Status  Partially Met    Target Date  04/01/20      SLP LONG TERM GOAL #2   Title  Pt will answer wh questions re: functional reading tasks with 90% accuracy independently.    Time  8    Period  Weeks     Status  On-going    Target Date  04/01/20      SLP LONG TERM GOAL #3   Title  Pt will accurately describe a pictured scene providing 3-5 detailed sentences with 80% accuracy independently.    Time  8    Period  Weeks    Status  Revised    Target Date  04/01/20      SLP LONG TERM GOAL #4   Title  Pt will complete visual attention/vigilance/ memory tasks with 80% accuracy given min cues.    Time  8    Period  Weeks    Status  On-going    Target Date  04/01/20      SLP LONG TERM GOAL #5   Title  Pt will be independent for use of compensatory strategies to aid word finding and short term recall with 80% accuracy.    Time  8    Period  Weeks    Status  On-going    Target Date  04/01/20       Plan - 02/18/20 1609    Clinical Impression Statement  Pt demonstrated improved speed and independent accuracy w/confrontation naming this session! Pt & husband have obtained an iPad and are planning on purchasing 2 [employer] apps: "Naming therapy,  Advanced Naming Therapy, and Advanced Comprehension" to provide consistent home practice.  Pt will benefit from continued skilled ST tx to promote restoration of cognitive linguistic skills and provide pt education re: compensatory strategies to improve functional independence.   Speech Therapy Frequency  2x / week    Duration  Other (comment)   8 weeks   Treatment/Interventions  SLP instruction and feedback;Compensatory strategies;Functional tasks;Cognitive reorganization;Internal/external aids;Patient/family education;Cueing hierarchy    Potential to Achieve Goals  Good    Potential Considerations  Ability to learn/carryover information;Family/community support;Previous level of function;Cooperation/participation level    SLP Home Exercise Plan  Word finding tasks provided, download and begin daily home practice with [employer]'s Language Therapy app, home use of compensatory strategies to aid word finding    Consulted and Agree with Plan  of Care  Patient;Family member/caregiver    Family Member Consulted  husband       Patient will benefit from skilled therapeutic intervention in order to improve the following deficits and impairments:   Aphasia  Cognitive communication deficit    Problem List There are no problems to display for this patient.   [place], MA, CCC-SLP 02/18/2020, 4:20 PM  [place] MAIN [facility] SERVICES [address] [place], [place], [address] Phone: [phone]   Fax:  [phone]   Name: Ainslee Sou MRN: [id number] Date of  Birth: 1941/07/14

## 2020-02-22 ENCOUNTER — Ambulatory Visit: Payer: Medicare Other | Admitting: Speech Pathology

## 2020-02-29 ENCOUNTER — Ambulatory Visit: Payer: Medicare Other | Admitting: Speech Pathology

## 2020-03-03 ENCOUNTER — Encounter: Payer: Self-pay | Admitting: Speech Pathology

## 2020-03-03 ENCOUNTER — Ambulatory Visit: Payer: Medicare Other | Attending: Neurology | Admitting: Speech Pathology

## 2020-03-03 ENCOUNTER — Other Ambulatory Visit: Payer: Self-pay

## 2020-03-03 ENCOUNTER — Ambulatory Visit: Payer: Medicare Other | Admitting: Speech Pathology

## 2020-03-03 DIAGNOSIS — R41841 Cognitive communication deficit: Secondary | ICD-10-CM | POA: Diagnosis present

## 2020-03-03 DIAGNOSIS — R4701 Aphasia: Secondary | ICD-10-CM | POA: Insufficient documentation

## 2020-03-03 NOTE — Therapy (Signed)
[place] MAIN [facility] SERVICES [address] [place], [place], [address] Phone: [phone]   Fax:  [phone]  Speech Language Pathology Treatment  Patient Details  Name: Allison Ramos MRN: [id number] Date of Birth: 1941-02-02 No data recorded  Encounter Date: 03/03/2020  End of Session - 03/03/20 1459    Visit Number  18    Number of Visits  25    Date for SLP Re-Evaluation  04/01/20    Authorization - Visit Number  7    Authorization - Number of Visits  10    SLP Start Time  0626    SLP Stop Time   1455    SLP Time Calculation (min)  65 min    Activity Tolerance  Patient tolerated treatment well       Past Medical History:  Diagnosis Date  . Hyperlipidemia   . Osteoarthrosis     Past Surgical History:  Procedure Laterality Date  . ABDOMINAL HYSTERECTOMY      There were no vitals filed for this visit.  Subjective Assessment - 03/03/20 1457    Subjective  Pt reported she worked on her HEP and brought it to tx today.    Patient is accompained by:  Family member   husband   Currently in Pain?  No/denies            ADULT SLP TREATMENT - 03/03/20 0001      General Information   Behavior/Cognition  Alert;Cooperative;Pleasant mood    Patient Positioning  Upright in chair    Oral care provided  N/A    HPI  This 79 y/o female presented to [name] on 09/15/2019 due to c/o not being able to verbalize correctly.Husband reports this has been worsening over the past 1-2 years.  Pt reported she often knows the word but can't get it out. She has no hx of head trauma, MRI revealed mild microvascular ischemic changes only, no significant ischemic events noted. Pt does report family hx of Alzheimer's disease. Pt struggles to perform tasks that she used to perform easily, ie checking emails, text messages. Per her dtr's report when patient is asked to check her email, she may check her text messages instead. Pt struggles to remember  names. Pt able to drive without making mistakes or getting lost. Pt stated she is somewhat depressed and has low motivation to do things she once enjoyed. Pt reports when she attempts to read or do crossword puzzles, she experiences blurry vision.  She is here seeking assessment and treatment of her increasingly worsening word finding difficulties.      Treatment Provided   Treatment provided  Cognitive-Linquistic      Cognitive-Linquistic Treatment   Treatment focused on  Cognition;Aphasia;Patient/family/caregiver education    Skilled Treatment  Pt named pictured objects with 90% independently. Pt completed written sentences missing a word at the end of the sentence with 90% accuracy independently and in the middle of the sentence with 90% accuracy given min to mod verbal cues. Pt described pictured scenes providing 3-5 details given min to mod verbal cues with 90% accuracy. Pt listened to min to mod complex sentences and matched to appropriate picture (FO:4) with 75% accuracy given repetition and min cues, and matched simple sentences with 100% accuracy independently.  Pt followed simple 2 step directions w/100% accuracy independently, and mod complex 2 step directions with 80% accuracy given repetition 1-2 times. Educated pt and husband further re: use and optimization of [employer]'s "Naming  therapy, Advanced Naming therapy & Advanced Comprehension" app for increased daily practice to facilitate improved word retrieval and fluency.     Assessment / Recommendations / Plan   Plan  Continue with current plan of care      Progression Toward Goals   Progression toward goals  Progressing toward goals       SLP Education - 03/03/20 1458    Education Details  re: compensatory strategies to aid word finding, HEP    Person(s) Educated  Patient;Spouse    Methods  Explanation;Demonstration;Verbal cues    Comprehension  Verbalized understanding;Need further instruction;Verbal cues  required;Returned demonstration         SLP Long Term Goals - 02/08/20 1718      SLP LONG TERM GOAL #1   Title  Pt will name/describe objects/pictures with at least 3 semantic features independently.    Time  8    Period  Weeks    Status  Partially Met    Target Date  04/01/20      SLP LONG TERM GOAL #2   Title  Pt will answer wh questions re: functional reading tasks with 90% accuracy independently.    Time  8    Period  Weeks    Status  On-going    Target Date  04/01/20      SLP LONG TERM GOAL #3   Title  Pt will accurately describe a pictured scene providing 3-5 detailed sentences with 80% accuracy independently.    Time  8    Period  Weeks    Status  Revised    Target Date  04/01/20      SLP LONG TERM GOAL #4   Title  Pt will complete visual attention/vigilance/ memory tasks with 80% accuracy given min cues.    Time  8    Period  Weeks    Status  On-going    Target Date  04/01/20      SLP LONG TERM GOAL #5   Title  Pt will be independent for use of compensatory strategies to aid word finding and short term recall with 80% accuracy.    Time  8    Period  Weeks    Status  On-going    Target Date  04/01/20       Plan - 03/03/20 1459    Clinical Impression Statement  Pt demonstrated improved independence with confrontation naming this session. Pt has now obtained 2 therapy app's on her iPad to aid consistent daily practice of her HEP and carryover of strategies to aid word finding. Pt will benefit from continued skilled ST tx to promote restoration of cognitive linguistic skills and provide pt education re: compensatory strategies to improve functional independence.   Speech Therapy Frequency  2x / week    Duration  Other (comment)   8 weeks   Treatment/Interventions  SLP instruction and feedback;Compensatory strategies;Functional tasks;Cognitive reorganization;Internal/external aids;Patient/family education;Cueing hierarchy    Potential to Achieve Goals  Good     Potential Considerations  Ability to learn/carryover information;Family/community support;Previous level of function;Cooperation/participation level    SLP Home Exercise Plan  Word finding tasks provided, daily home practice with [employer]'s Language Therapy app, home use of compensatory strategies to aid word finding    Consulted and Agree with Plan of Care  Patient;Family member/caregiver    Family Member Consulted  husband       Patient will benefit from skilled therapeutic intervention in order to improve the following deficits and impairments:  Aphasia  Cognitive communication deficit    Problem List There are no problems to display for this patient.   [place], MA, CCC-SLP 03/03/2020, 3:19 PM  [place] MAIN [facility] SERVICES [address] [place], [place], [address] Phone: [phone]   Fax:  [phone]   Name: Allison Ramos MRN: [id number] Date of Birth: 04/06/1941

## 2020-03-07 ENCOUNTER — Encounter: Payer: Self-pay | Admitting: Emergency Medicine

## 2020-03-07 ENCOUNTER — Ambulatory Visit: Payer: Medicare Other | Admitting: Speech Pathology

## 2020-03-07 ENCOUNTER — Emergency Department: Payer: Medicare Other

## 2020-03-07 ENCOUNTER — Ambulatory Visit
Admission: EM | Admit: 2020-03-07 | Discharge: 2020-03-07 | Disposition: A | Discharge status: Home / Self Care | Payer: Medicare Other

## 2020-03-07 ENCOUNTER — Other Ambulatory Visit: Payer: Self-pay

## 2020-03-07 ENCOUNTER — Emergency Department
Admission: EM | Admit: 2020-03-07 | Discharge: 2020-03-07 | Disposition: A | Discharge status: Home / Self Care | Payer: Medicare Other | Attending: Emergency Medicine | Admitting: Emergency Medicine

## 2020-03-07 ENCOUNTER — Encounter: Payer: Self-pay | Admitting: Speech Pathology

## 2020-03-07 DIAGNOSIS — S62614A Displaced fracture of proximal phalanx of right ring finger, initial encounter for closed fracture: Secondary | ICD-10-CM | POA: Diagnosis not present

## 2020-03-07 DIAGNOSIS — Y999 Unspecified external cause status: Secondary | ICD-10-CM | POA: Insufficient documentation

## 2020-03-07 DIAGNOSIS — Y929 Unspecified place or not applicable: Secondary | ICD-10-CM | POA: Diagnosis not present

## 2020-03-07 DIAGNOSIS — S62619A Displaced fracture of proximal phalanx of unspecified finger, initial encounter for closed fracture: Secondary | ICD-10-CM

## 2020-03-07 DIAGNOSIS — W231XXA Caught, crushed, jammed, or pinched between stationary objects, initial encounter: Secondary | ICD-10-CM | POA: Insufficient documentation

## 2020-03-07 DIAGNOSIS — R4701 Aphasia: Secondary | ICD-10-CM

## 2020-03-07 DIAGNOSIS — S6991XA Unspecified injury of right wrist, hand and finger(s), initial encounter: Secondary | ICD-10-CM | POA: Diagnosis present

## 2020-03-07 DIAGNOSIS — Y9301 Activity, walking, marching and hiking: Secondary | ICD-10-CM | POA: Insufficient documentation

## 2020-03-07 DIAGNOSIS — Z7982 Long term (current) use of aspirin: Secondary | ICD-10-CM | POA: Diagnosis not present

## 2020-03-07 DIAGNOSIS — Z79899 Other long term (current) drug therapy: Secondary | ICD-10-CM | POA: Diagnosis not present

## 2020-03-07 DIAGNOSIS — R41841 Cognitive communication deficit: Secondary | ICD-10-CM

## 2020-03-07 MED ORDER — LIDOCAINE HCL (PF) 1 % IJ SOLN
10.0000 mL | Freq: Once | INTRAMUSCULAR | Status: AC
  Administered 2020-03-07: 10 mL
  Filled 2020-03-07: qty 10

## 2020-03-07 MED ORDER — HYDROCODONE-ACETAMINOPHEN 5-325 MG PO TABS: 1.0000 | ORAL_TABLET | ORAL | 0 refills | Status: DC | PRN

## 2020-03-07 NOTE — Discharge Instructions (Addendum)
Keep the splint on, dry until seeing hand surgery.  I have sent in a prescription to [facility] for pain medicine for you.   I have included the before and after x-ray.  Follow-up with hand surgery to determine the recommendation whether he will need a pin placement to promote healing of this fracture.  You have a referral to hand surgery, however you will need to call tomorrow to set up the appointment.

## 2020-03-07 NOTE — ED Triage Notes (Signed)
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

## 2020-03-07 NOTE — Therapy (Signed)
[place] MAIN [facility] SERVICES [address] [place], [place], [address] Phone: [phone]   Fax:  [phone]  Speech Language Pathology Treatment  Patient Details  Name: Allison Ramos MRN: [id number] Date of Birth: 02-07-1941 No data recorded  Encounter Date: 03/07/2020  End of Session - 03/07/20 1506    Visit Number  19    Number of Visits  25    Date for SLP Re-Evaluation  04/01/20    Authorization - Visit Number  8    Authorization - Number of Visits  10    Progress Note Due on Visit  10    SLP Start Time  1400    SLP Stop Time   1500    SLP Time Calculation (min)  60 min    Activity Tolerance  Patient tolerated treatment well       Past Medical History:  Diagnosis Date  . Hyperlipidemia   . Osteoarthrosis     Past Surgical History:  Procedure Laterality Date  . ABDOMINAL HYSTERECTOMY      There were no vitals filed for this visit.  Subjective Assessment - 03/07/20 1503    Subjective  Pt reported she worked on her new app "Naming" and Auditory Comprehension" on her iPad.    Currently in Pain?  No/denies            ADULT SLP TREATMENT - 03/07/20 0001      General Information   Behavior/Cognition  Alert;Cooperative;Pleasant mood    Patient Positioning  Upright in chair    HPI  This 79 y/o female presented to [name] on 09/15/2019 due to c/o not being able to verbalize correctly.Husband reports this has been worsening over the past 1-2 years.  Pt reported she often knows the word but can't get it out. She has no hx of head trauma, MRI revealed mild microvascular ischemic changes only, no significant ischemic events noted. Pt does report family hx of Alzheimer's disease. Pt struggles to perform tasks that she used to perform easily, ie checking emails, text messages. Per her dtr's report when patient is asked to check her email, she may check her text messages instead. Pt struggles to remember names. Pt able to  drive without making mistakes or getting lost. Pt stated she is somewhat depressed and has low motivation to do things she once enjoyed. Pt reports when she attempts to read or do crossword puzzles, she experiences blurry vision.  She is here seeking assessment and treatment of her increasingly worsening word finding difficulties.      Treatment Provided   Treatment provided  Cognitive-Linquistic      Cognitive-Linquistic Treatment   Treatment focused on  Cognition;Aphasia;Patient/family/caregiver education    Skilled Treatment  Confrontation naming: Pt named pictured objects with 100% accuracy given min cues as needed. Pt described pictured objects providing 3-5 semantic features given mod verbal cues with 80% accuracy. Educated pt and husband further re: use and optimization of [employer]'s "Naming therapy, Advanced Naming therapy & Advanced Comprehension" app for increased daily practice to facilitate improved word retrieval and fluency.       Assessment / Recommendations / Plan   Plan  Continue with current plan of care      Progression Toward Goals   Progression toward goals  Progressing toward goals       SLP Education - 03/07/20 1505    Education Details  re: compensatory strategies to aid word finding, how to maximize use of therapy app's  on iPad, HEP    Person(s) Educated  Patient;Spouse    Methods  Explanation;Demonstration;Verbal cues    Comprehension  Verbalized understanding;Need further instruction;Returned demonstration;Verbal cues required         SLP Long Term Goals - 02/08/20 1718      SLP LONG TERM GOAL #1   Title  Pt will name/describe objects/pictures with at least 3 semantic features independently.    Time  8    Period  Weeks    Status  Partially Met    Target Date  04/01/20      SLP LONG TERM GOAL #2   Title  Pt will answer wh questions re: functional reading tasks with 90% accuracy independently.    Time  8    Period  Weeks    Status  On-going     Target Date  04/01/20      SLP LONG TERM GOAL #3   Title  Pt will accurately describe a pictured scene providing 3-5 detailed sentences with 80% accuracy independently.    Time  8    Period  Weeks    Status  Revised    Target Date  04/01/20      SLP LONG TERM GOAL #4   Title  Pt will complete visual attention/vigilance/ memory tasks with 80% accuracy given min cues.    Time  8    Period  Weeks    Status  On-going    Target Date  04/01/20      SLP LONG TERM GOAL #5   Title  Pt will be independent for use of compensatory strategies to aid word finding and short term recall with 80% accuracy.    Time  8    Period  Weeks    Status  On-going    Target Date  04/01/20       Plan - 03/07/20 1506    Clinical Impression Statement  Pt continues to demonstrate improved independence and speed with confrontation naming; however increased difficulty with naming semantic features in comparison to previous sessions. Pt and husband report she has been using 2 therapy app's on her iPad ("Naming therapy and Advanced Comprehension") to aid consistent daily practice of her HEP and carryover of strategies to aid word finding. He reported he noticed she has visual fatigue due to visual disturbances as trials progress on iPad and does better when trials are limited to 25 vs 50. Husband reported they are investigating further assessment and treatment re: visual disturbances.    Pt to d/c from ST tx at this time as this SLP is leaving [place] today and they expressed wishes to wait on transitioning to a new therapist after they pursue evaluation by a new doctor. They stated wishes to pursue consistent home practice of materials provided and via therapy app's in the meantime.   Speech Therapy Frequency  2x / week    Duration  Other (comment)   8 weeks   Treatment/Interventions  SLP instruction and feedback;Compensatory strategies;Functional tasks;Cognitive reorganization;Internal/external  aids;Patient/family education;Cueing hierarchy    Potential to Achieve Goals  Good    Potential Considerations  Ability to learn/carryover information;Family/community support;Previous level of function;Cooperation/participation level    SLP Home Exercise Plan  Word finding tasks provided, daily home practice with [employer]'s Language Therapy app, home use of compensatory strategies to aid word finding    Consulted and Agree with Plan of Care  Patient;Family member/caregiver       Patient will benefit from skilled therapeutic  intervention in order to improve the following deficits and impairments:   Aphasia  Cognitive communication deficit    Problem List There are no problems to display for this patient.   Mare Loan, [place] 03/07/2020, 3:22 PM  [place] MAIN [facility] SERVICES [address] [place], [place], [address] Phone: [phone]   Fax:  [phone]   Name: Louanna Vanliew MRN: [id number] Date of Birth: Nov 13, 1941

## 2020-03-07 NOTE — ED Provider Notes (Signed)
[facility] Emergency Department Provider Note  ____________________________________________  Time seen: Approximately 5:25 PM  I have reviewed the triage vital signs and the nursing notes.   HISTORY  Chief Complaint Finger Injury    HPI Allison Ramos is a 79 y.o. female who presents the emergency department complaining of pain, deformity to the ring finger of the right hand.  Patient was walking her dog, the dog ran in the leash became wrapped around her finger.  Patient has suffered an injury with obvious deformity to the ring finger.  No open wounds are reported.  No other injury or complaint.  No history of previous injury to this digit.         Past Medical History:  Diagnosis Date  . Hyperlipidemia   . Osteoarthrosis     There are no problems to display for this patient.   Past Surgical History:  Procedure Laterality Date  . ABDOMINAL HYSTERECTOMY      Prior to Admission medications   Medication Sig Start Date End Date Taking? Authorizing Provider  aspirin 81 MG chewable tablet Chew by mouth daily.    [provider]  atorvastatin (LIPITOR) 40 MG tablet Take 40 mg by mouth daily.    [provider]  HYDROcodone-acetaminophen (NORCO/VICODIN) 5-325 MG tablet Take 1 tablet by mouth every 4 (four) hours as needed for moderate pain. 03/07/20   Beaux Wedemeyer, Charline Bills, PA-C  levothyroxine (SYNTHROID, LEVOTHROID) 50 MCG tablet Take 50 mcg by mouth daily before breakfast.    [provider]  memantine (NAMENDA) 5 MG tablet Take 10 mg by mouth 2 (two) times daily.     [provider]  pantoprazole (PROTONIX) 40 MG tablet Take 40 mg by mouth daily.    [provider]    Allergies Darvon [propoxyphene]  No family history on file.  Social History Social History   Tobacco Use  . Smoking status: Never Smoker  . Smokeless tobacco: Never Used  Substance Use Topics  . Alcohol use: Yes     Alcohol/week: 1.0 standard drinks    Types: 1 Glasses of wine per week  . Drug use: Never     Review of Systems  Constitutional: No fever/chills Eyes: No visual changes. No discharge ENT: No upper respiratory complaints. Cardiovascular: no chest pain. Respiratory: no cough. No SOB. Gastrointestinal: No abdominal pain.  No nausea, no vomiting.  No diarrhea.  No constipation. Musculoskeletal: Injury to the ring finger of the right hand.  Obvious deformity Skin: Negative for rash, abrasions, lacerations, ecchymosis. Neurological: Negative for headaches, focal weakness or numbness. 10-point ROS otherwise negative.  ____________________________________________   PHYSICAL EXAM:  VITAL SIGNS: ED Triage Vitals  Enc Vitals Group     BP --      Pulse --      Resp --      Temp --      Temp src --      SpO2 --      Weight 03/07/20 1723 180 lb (81.6 kg)     Height 03/07/20 1723 5\' 7"  (1.702 m)     Head Circumference --      Peak Flow --      Pain Score 03/07/20 1722 7     Pain Loc --      Pain Edu? --      Excl. in [place]? --      Constitutional: Alert and oriented. Well appearing and in no acute distress. Eyes: Conjunctivae are normal. PERRL. EOMI. Head:  Atraumatic. ENT:      Ears:       Nose: No congestion/rhinnorhea.      Mouth/Throat: Mucous membranes are moist.  Neck: No stridor.    Cardiovascular: Normal rate, regular rhythm. Normal S1 and S2.  Good peripheral circulation. Respiratory: Normal respiratory effort without tachypnea or retractions. Lungs CTAB. Good air entry to the bases with no decreased or absent breath sounds. Musculoskeletal: Full range of motion to all extremities. No gross deformities appreciated.  Visualization of the right hand reveals obvious deformity to the ring finger of the right hand.  Patient has medial deviation about the PIP joint.  Edema and ecchymosis noted to the proximal phalanx.  Sensation still intact.  Capillary refill intact.  Patient is  unable to extend or flex the finger at this time.  No other visible abnormality to the hand.  No other tenderness to palpation other than the proximal phalanx and PIP joint of the ring finger. Neurologic:  Normal speech and language. No gross focal neurologic deficits are appreciated.  Skin:  Skin is warm, dry and intact. No rash noted. Psychiatric: Mood and affect are normal. Speech and behavior are normal. Patient exhibits appropriate insight and judgement.   ____________________________________________   LABS (all labs ordered are listed, but only abnormal results are displayed)  Labs Reviewed - No data to display ____________________________________________  EKG   ____________________________________________  RADIOLOGY I personally viewed and evaluated these images as part of my medical decision making, as well as reviewing the written report by the radiologist.  I concur with radiologist finding of displaced angulated proximal phalanx fracture of the ring finger.  Postreduction films reveal significant improvement of fracture.  DG Hand Complete Right  Result Date: 03/07/2020 CLINICAL DATA:  Right ring finger injury with deformity. Fingers got tangled in dog leash. EXAM: RIGHT HAND - COMPLETE 3+ VIEW COMPARISON:  None. FINDINGS: Fourth proximal phalanx fracture is displaced and angulated with apex radial angulation. Fracture extends distally but no intra-articular extension. Distal digit remains aligned with the distal fracture fragment. No other fracture of the hand. Moderate osteoarthritis at the base of the thumb. IMPRESSION: Displaced angulated fourth proximal phalanx fracture without intra-articular extension. Electronically Signed   By: Keith Rake M.D.   On: 03/07/2020 18:21    ____________________________________________    PROCEDURES  Procedure(s) performed:    Reduction of fracture  Date/Time: 03/07/2020 6:55 PM Performed by: Darletta Moll,  PA-C Authorized by: Darletta Moll, PA-C  Consent: Verbal consent obtained. Risks and benefits: risks, benefits and alternatives were discussed Consent given by: patient Patient understanding: patient states understanding of the procedure being performed Imaging studies: imaging studies available Patient identity confirmed: verbally with patient Time out: Immediately prior to procedure a "time out" was called to verify the correct patient, procedure, equipment, support staff and site/side marked as required. Local anesthesia used: yes Anesthesia: digital block  Anesthesia: Local anesthesia used: yes Local Anesthetic: lidocaine 1% without epinephrine Anesthetic total: 4 mL  Sedation: Patient sedated: no  Patient tolerance: patient tolerated the procedure well with no immediate complications Comments: Digital block placed into the ring finger of the right hand.  Good anesthesia.  Patient with good sensation and capillary refill prior to start of procedure and digital block.  Using direct manipulation, traction, manipulation of the fracture was performed.  MCP joint was stabilized during this procedure.  Palpable and visual improvement of fracture was obtained with reduction.  Capillary refill intact, sensation absent due to digital block and  unable to be evaluated.  Patient's third and fourth digits were buddy taped and finger splint applied to the palmar aspect of the finger extending into the palm itself.  Marland KitchenSplint Application  Date/Time: 03/07/2020 6:57 PM Performed by: Darletta Moll, PA-C Authorized by: Darletta Moll, PA-C   Consent:    Consent obtained:  Verbal   Consent given by:  Patient   Risks discussed:  Pain   Alternatives discussed:  Referral Pre-procedure details:    Sensation:  Normal Procedure details:    Laterality:  Right   Location:  Finger   Finger:  R ring finger   Splint type:  Finger   Supplies:  Aluminum splint and elastic  bandage Post-procedure details:    Pain:  Improved   Post-procedure CMS: unable to access after digital block.   Patient tolerance of procedure:  Tolerated well, no immediate complications      Medications  lidocaine (PF) (XYLOCAINE) 1 % injection 10 mL (has no administration in time range)     ____________________________________________   INITIAL IMPRESSION / ASSESSMENT AND PLAN / ED COURSE  Pertinent labs & imaging results that were available during my care of the patient were reviewed by me and considered in my medical decision making (see chart for details).  Review of the [place] CSRS was performed in accordance of the [place] prior to dispensing any controlled drugs.           Patient's diagnosis is consistent with closed fracture of the proximal phalanx of the ring finger right hand.  Patient presented to emergency department after sustaining an injury to the ring finger of the right hand.  Obvious deformity.  Patient had good pulses and sensation.  Imaging reveals fracture with significant angulation.  At this time reduction was performed to ensure good blood flow.  Fingers are buddy taped, splinted after reduction.  Patient has good capillary refill after procedure.  Sensation is unable to be assessed given digital block, however patient had good sensation prior to procedure.  Patient will be prescribed limited prescription for Vicodin should she have significant pain.  Follow-up with hand surgery.. Patient is given ED precautions to return to the ED for any worsening or new symptoms.     ____________________________________________  FINAL CLINICAL IMPRESSION(S) / ED DIAGNOSES  Final diagnoses:  Closed fracture of proximal phalanx of digit of right hand, initial encounter      NEW MEDICATIONS STARTED DURING THIS VISIT:  ED Discharge Orders         Ordered    HYDROcodone-acetaminophen (NORCO/VICODIN) 5-325 MG tablet  Every 4 hours PRN     03/07/20 1916               This chart was dictated using voice recognition software/Dragon. Despite best efforts to proofread, errors can occur which can change the meaning. Any change was purely unintentional.    Darletta Moll, PA-C 03/07/20 1920    Nance Pear, MD 03/07/20 2127

## 2020-05-17 ENCOUNTER — Other Ambulatory Visit: Payer: Self-pay

## 2020-05-17 ENCOUNTER — Ambulatory Visit
Admission: EM | Admit: 2020-05-17 | Discharge: 2020-05-17 | Disposition: A | Discharge status: Home / Self Care | Payer: Medicare Other | Attending: Urgent Care | Admitting: Urgent Care

## 2020-05-17 DIAGNOSIS — N390 Urinary tract infection, site not specified: Secondary | ICD-10-CM

## 2020-05-17 HISTORY — DX: Unspecified dementia, unspecified severity, without behavioral disturbance, psychotic disturbance, mood disturbance, and anxiety: F03.90

## 2020-05-17 LAB — URINALYSIS, COMPLETE (UACMP) WITH MICROSCOPIC
Bilirubin Urine: NEGATIVE
Glucose, UA: NEGATIVE mg/dL
Ketones, ur: NEGATIVE mg/dL
Nitrite: NEGATIVE
Protein, ur: NEGATIVE mg/dL
Specific Gravity, Urine: 1.02 (ref 1.005–1.030)
WBC, UA: >50 WBC/hpf (ref 0–5)
pH: 7 (ref 5.0–8.0)

## 2020-05-17 MED ORDER — FLUCONAZOLE 150 MG PO TABS: ORAL_TABLET | ORAL | 0 refills | Status: DC

## 2020-05-17 MED ORDER — SULFAMETHOXAZOLE-TRIMETHOPRIM 800-160 MG PO TABS: 1.0000 | ORAL_TABLET | Freq: Two times a day (BID) | ORAL | 0 refills | Status: AC

## 2020-05-17 NOTE — ED Triage Notes (Signed)
Pt reports bladder pressure and dysuria. Burning with urination for past couple weeks.

## 2020-05-17 NOTE — Discharge Instructions (Addendum)
It was very nice seeing you today in clinic. Thank you for entrusting me with your care.  ° °As discussed, your urine is POSITIVE for infection. Will approach treatment as follows: ° °Prescription has been sent to your pharmacy for antibiotics.  °Please pick up and take as directed. FINISH the entire course of medication even if you are feeling better.  °A culture will be sent on your provided sample. If it comes back resistant to what I have prescribed you, someone will call you and let you know that we will need to change antibiotics. °Increase fluid intake as much as possible to flush your urinary tract.  °Water is always the best.  °Avoid caffeine until your infection clears up, as it can contribute to painful bladder spasms.  °May use Tylenol and/or Ibuprofen as needed for pain/fever. ° °Make arrangements to follow up with your regular doctor in 1 week for re-evaluation. If your symptoms/condition worsens, please seek follow up care either here or in the ER. Please remember, our [facility] providers are "right here with you" when you need us.  ° °Again, it was my pleasure to take care of you today. Thank you for choosing our clinic. I hope that you start to feel better quickly.  ° °Michell Kader, MSN, APRN, FNP-C, CEN °Advanced Practice Provider °[facility] ° °

## 2020-05-17 NOTE — ED Provider Notes (Signed)
[place], [place]   Name: Allison Ramos DOB: 12-03-1941 MRN: [id number] CSN: [id number] PCP: Verdie Shire, MD  Arrival date and time:  05/17/20 1421  Chief Complaint:  Dysuria  NOTE: Prior to seeing the patient today, I have reviewed the triage nursing documentation and vital signs. Clinical staff has updated patient's PMH/PSHx, current medication list, and drug allergies/intolerances to ensure comprehensive history available to assist in medical decision making.   History:   HPI: Allison Ramos is a 79 y.o. female who presents today with complaints of urinary symptoms that began approximately 2 weeks ago. She complains of urinary pressure, frequency, and urgency. She has not appreciated any gross hematuria, nor has she noticed her urine being malodorous. Patient denies any associated nausea, vomiting, fever, or chills. She has not experienced any pain in her lower back, flank area, or abdomen. Patient advises that she does have a past medical history that is significant for recurrent urinary tract infections. She denies any vaginal pain, bleeding, or discharge.   Past Medical History:  Diagnosis Date  . Dementia ([place])   . Hyperlipidemia   . Osteoarthrosis     Past Surgical History:  Procedure Laterality Date  . ABDOMINAL HYSTERECTOMY      History reviewed. No pertinent family history.  Social History   Tobacco Use  . Smoking status: Never Smoker  . Smokeless tobacco: Never Used  Substance Use Topics  . Alcohol use: Yes    Alcohol/week: 1.0 standard drinks    Types: 1 Glasses of wine per week  . Drug use: Never    There are no problems to display for this patient.   Home Medications:    No outpatient medications have been marked as taking for the 05/17/20 encounter [facility] Encounter).    Allergies:   Darvon [propoxyphene]  Review of Systems (ROS):  Review of systems NEGATIVE unless otherwise noted in narrative H&P section.   Vital Signs: Today's  Vitals   05/17/20 1430 05/17/20 1431 05/17/20 1507  BP:  (!) 149/84   Pulse:  85   Resp:  16   Temp:  97.8 F (36.6 C)   TempSrc:  Oral   Weight: 165 lb (74.8 kg)    Height: 5' 8.5" (1.74 m)    PainSc: 8   8     Physical Exam: Physical Exam  Urgent Care Treatments / Results:   Orders Placed This Encounter  Procedures  . Urine culture  . Urinalysis, Complete w Microscopic    LABS: PLEASE NOTE: all labs that were ordered this encounter are listed, however only abnormal results are displayed. Labs Reviewed  URINALYSIS, COMPLETE (UACMP) WITH MICROSCOPIC - Abnormal; Notable for the following components:      Result Value   APPearance HAZY (*)    Hgb urine dipstick TRACE (*)    Leukocytes,Ua SMALL (*)    Bacteria, UA MANY (*)    All other components within normal limits  URINE CULTURE    EKG: -None  RADIOLOGY: No results found.  PROCEDURES: Procedures  MEDICATIONS RECEIVED THIS VISIT: Medications - No data to display  PERTINENT CLINICAL COURSE NOTES/UPDATES:   Initial Impression / Assessment and Plan / Urgent Care Course:  Pertinent labs & imaging results that were available during my care of the patient were personally reviewed by me and considered in my medical decision making (see lab/imaging section of note for values and interpretations).  Allison Ramos is a 79 y.o. female who [facility] today with complaints of Dysuria  Patient is well appearing overall in clinic today. She does not appear to be in any acute distress. Presenting symptoms (see HPI) and exam as documented above.  Marland Kitchen UA was (+) for infection; reflex culture sent.  o Will treat with a 5 day course of SMZ-TMP. Patient encouraged to complete the entire course of antibiotics even if she begins to feel better. She was advised that if culture demonstrates resistance to the prescribed antibiotic, she will be contacted and advised of the need to change the antibiotic being used  to treat her infection.   o Patient encouraged to increase her fluid intake as much as possible. Discussed that water is always best to flush the urinary tract. She was advised to avoid caffeine containing fluids until her infections clears, as caffeine can cause her to experience painful bladder spasms.   o May use Tylenol and/or Ibuprofen as needed for pain/fever. May also use over the counter phenazopyridine to help relieve her current urinary pain.   o Patient has has a history of vulvovaginal candidiasis in the past while on oral antimicrobial therapy. Will send in prophylactic fluconazole dose (150 mg x 1 - may repeat in 72 hours if still symptomatic) for patient to use should she develop symptoms.   Discussed follow up with primary care physician in 1 week for re-evaluation. I have reviewed the follow up and strict return precautions for any new or worsening symptoms. Patient is aware of symptoms that would be deemed urgent/emergent, and would thus require further evaluation either here or in the emergency department. At the time of discharge, she verbalized understanding and consent with the discharge plan as it was reviewed with her. All questions were fielded by provider and/or clinic staff prior to patient discharge.    Final Clinical Impressions / Urgent Care Diagnoses:   Final diagnoses:  Urinary tract infection without hematuria, site unspecified    New Prescriptions:  [place] Controlled Substance Registry consulted? Not Applicable  Meds ordered this encounter  Medications  . sulfamethoxazole-trimethoprim (BACTRIM DS) 800-160 MG tablet    Sig: Take 1 tablet by mouth 2 (two) times daily for 5 days.    Dispense:  10 tablet    Refill:  0  . fluconazole (DIFLUCAN) 150 MG tablet    Sig: Take 1 tablet (150 mg) PO x 1 dose. May repeat 150 mg dose in 3 days if still symptomatic.    Dispense:  2 tablet    Refill:  0    Recommended Follow up Care:  Patient encouraged to follow up with  the following provider within the specified time frame, or sooner as dictated by the severity of her symptoms. As always, she was instructed that for any urgent/emergent care needs, she should seek care either here or in the emergency department for more immediate evaluation.  Follow-up Information    Bowen, Lauren, MD In 1 week.   Specialty: Family Medicine Why: General reassessment of symptoms if not improving Contact information: [address] [address] [phone]         NOTE: This note was prepared using Dragon dictation software along with smaller phrase technology. Despite my best ability to proofread, there is the potential that transcriptional errors may still occur from this process, and are completely unintentional.    Karen Kitchens, NP 05/17/20 1725

## 2020-05-20 LAB — URINE CULTURE: Culture: >=100000 — AB

## 2020-06-22 DIAGNOSIS — D472 Monoclonal gammopathy: Secondary | ICD-10-CM | POA: Diagnosis present

## 2020-07-03 ENCOUNTER — Emergency Department
Admission: EM | Admit: 2020-07-03 | Discharge: 2020-07-03 | Disposition: A | Discharge status: Home / Self Care | Payer: Medicare Other | Attending: Emergency Medicine | Admitting: Emergency Medicine

## 2020-07-03 ENCOUNTER — Emergency Department: Payer: Medicare Other

## 2020-07-03 ENCOUNTER — Other Ambulatory Visit: Payer: Self-pay

## 2020-07-03 DIAGNOSIS — Z5321 Procedure and treatment not carried out due to patient leaving prior to being seen by health care provider: Secondary | ICD-10-CM | POA: Diagnosis not present

## 2020-07-03 DIAGNOSIS — R000 Tachycardia, unspecified: Secondary | ICD-10-CM | POA: Diagnosis not present

## 2020-07-03 DIAGNOSIS — R002 Palpitations: Secondary | ICD-10-CM | POA: Diagnosis present

## 2020-07-03 LAB — BASIC METABOLIC PANEL
Anion gap: 7 (ref 5–15)
BUN: 12 mg/dL (ref 8–23)
CO2: 28 mmol/L (ref 22–32)
Calcium: 8.9 mg/dL (ref 8.9–10.3)
Chloride: 107 mmol/L (ref 98–111)
Creatinine, Ser: 0.65 mg/dL (ref 0.44–1.00)
GFR calc Af Amer: >60 mL/min (ref >60)
GFR calc non Af Amer: >60 mL/min (ref >60)
Glucose, Bld: 125 mg/dL — ABNORMAL HIGH (ref 70–99)
Potassium: 3.7 mmol/L (ref 3.5–5.1)
Sodium: 142 mmol/L (ref 135–145)

## 2020-07-03 LAB — CBC
HCT: 41.1 % (ref 36.0–46.0)
Hemoglobin: 14.1 g/dL (ref 12.0–15.0)
MCH: 29.4 pg (ref 26.0–34.0)
MCHC: 34.3 g/dL (ref 30.0–36.0)
MCV: 85.6 fL (ref 80.0–100.0)
Platelets: 181 10*3/uL (ref 150–400)
RBC: 4.8 MIL/uL (ref 3.87–5.11)
RDW: 13.1 % (ref 11.5–15.5)
WBC: 7.8 10*3/uL (ref 4.0–10.5)
nRBC: 0 % (ref 0.0–0.2)

## 2020-07-03 LAB — TROPONIN I (HIGH SENSITIVITY): Troponin I (High Sensitivity): 3 ng/L (ref <18)

## 2020-07-03 NOTE — ED Triage Notes (Signed)
Patient c/o palpitations and tachycardia beginning 30 minutes PTA. Patient denies chest pain/SOB.

## 2020-07-04 ENCOUNTER — Telehealth: Payer: Self-pay | Admitting: Emergency Medicine

## 2020-07-04 NOTE — Telephone Encounter (Signed)
Called patient due to lwot to inquire about condition and follow up plans. No answer and mailbox is full.

## 2020-07-05 ENCOUNTER — Ambulatory Visit
Admission: EM | Admit: 2020-07-05 | Discharge: 2020-07-05 | Disposition: A | Discharge status: Home / Self Care | Payer: Medicare Other | Attending: Emergency Medicine | Admitting: Emergency Medicine

## 2020-07-05 ENCOUNTER — Other Ambulatory Visit: Payer: Self-pay

## 2020-07-05 DIAGNOSIS — R531 Weakness: Secondary | ICD-10-CM | POA: Insufficient documentation

## 2020-07-05 DIAGNOSIS — I447 Left bundle-branch block, unspecified: Secondary | ICD-10-CM | POA: Insufficient documentation

## 2020-07-05 DIAGNOSIS — Z20822 Contact with and (suspected) exposure to covid-19: Secondary | ICD-10-CM | POA: Insufficient documentation

## 2020-07-05 DIAGNOSIS — J069 Acute upper respiratory infection, unspecified: Secondary | ICD-10-CM | POA: Diagnosis present

## 2020-07-05 LAB — URINALYSIS, COMPLETE (UACMP) WITH MICROSCOPIC
Bilirubin Urine: NEGATIVE
Glucose, UA: NEGATIVE mg/dL
Hgb urine dipstick: NEGATIVE
Ketones, ur: NEGATIVE mg/dL
Leukocytes,Ua: NEGATIVE
Nitrite: NEGATIVE
Protein, ur: NEGATIVE mg/dL
RBC / HPF: NONE SEEN RBC/hpf (ref 0–5)
Specific Gravity, Urine: 1.01 (ref 1.005–1.030)
pH: 6 (ref 5.0–8.0)

## 2020-07-05 LAB — TROPONIN I (HIGH SENSITIVITY): Troponin I (High Sensitivity): 4 ng/L (ref <18)

## 2020-07-05 MED ORDER — FLUTICASONE PROPIONATE 50 MCG/ACT NA SUSP: 2.0000 | Freq: Every day | NASAL | 0 refills | Status: DC

## 2020-07-05 NOTE — ED Triage Notes (Signed)
Pt presents to UC for nasal congestion, sinus pressure, dizziness, loss of taste/smell, fatigue. Pt denies fever, chills, body aches, n/v/d, sore throat, cough. Pt requesting covid testing at this time.

## 2020-07-05 NOTE — ED Provider Notes (Addendum)
HPI  SUBJECTIVE:  Allison Ramos is a 79 y.o. female who presents with generalized weakness and malaise for the past 2 days.  Husband states that he noted an irregular pulse and she was reporting palpitations 2 days ago, they went to the ED where labs, chest x-ray, EKG was done, but they left prior to being seen.  She has had no further episodes of palpitations since but reports persistent weakness.  She states that she feels dizzy when she walks.  No chest pain, pressure, heaviness, shortness of breath, diaphoresis.  She reports body aches, headaches, left-sided nasal congestion, frontal headache, postnasal drip, loss of smell and taste.  She has a cough that is worse in the morning but she is unsure how long that has been there.  She is eating and drinking less due to decreased appetite.  No fevers, sinus pain or pressure, new or different sore throat, wheezing, shortness of breath, nausea, vomiting, diarrhea, abdominal pain.  No urinary complaints.  She took Tylenol within 6 hours of evaluation.  No antibiotics in the past month.  She tried Flonase, saline nasal spray, Tylenol 500 mg with improvement in her symptoms.  No aggravating factors.  She has a past medical history of dementia.  Additional history is provided by husband who is primary caretaker.  Also has a past medical history of hypercholesterolemia, UTI.  She is a former smoker, quit 1990.  No history of diabetes, hypertension, coronary disease, chronic kidney disease, pulmonary disease, HIV, cancer, immunocompromise, MI, palpitations.  KDT:OIZTI, Lauren, MD  ER labs from 2 days ago-  Normal BMP, CBC, CXR, negative troponin, EKG wiith LBB.  Past Medical History:  Diagnosis Date  . Dementia ([place])   . Hyperlipidemia   . Osteoarthrosis     Past Surgical History:  Procedure Laterality Date  . ABDOMINAL HYSTERECTOMY      History reviewed. No pertinent family history.  Social History   Tobacco Use  . Smoking status: Never  Smoker  . Smokeless tobacco: Never Used  Vaping Use  . Vaping Use: Never used  Substance Use Topics  . Alcohol use: Yes    Alcohol/week: 1.0 standard drink    Types: 1 Glasses of wine per week  . Drug use: Never    No current facility-administered medications for this encounter.  Current Outpatient Medications:  .  aspirin 81 MG chewable tablet, Chew by mouth daily., Disp: , Rfl:  .  atorvastatin (LIPITOR) 40 MG tablet, Take 40 mg by mouth daily., Disp: , Rfl:  .  levothyroxine (SYNTHROID, LEVOTHROID) 50 MCG tablet, Take 50 mcg by mouth daily before breakfast., Disp: , Rfl:  .  memantine (NAMENDA) 5 MG tablet, Take 10 mg by mouth 2 (two) times daily. , Disp: , Rfl:  .  fluconazole (DIFLUCAN) 150 MG tablet, Take 1 tablet (150 mg) PO x 1 dose. May repeat 150 mg dose in 3 days if still symptomatic., Disp: 2 tablet, Rfl: 0 .  fluticasone (FLONASE) 50 MCG/ACT nasal spray, Place 2 sprays into both nostrils daily., Disp: 16 g, Rfl: 0 .  Glucosamine-Chondroit-Vit C-Mn (GLUCOSAMINE 1500 COMPLEX PO), Take 2 capsules by mouth daily., Disp: , Rfl:  .  levocetirizine (XYZAL) 5 MG tablet, Take 5 mg by mouth daily., Disp: , Rfl:  .  Lutein 6 MG TABS, Take 1 tablet by mouth daily., Disp: , Rfl:  .  montelukast (SINGULAIR) 10 MG tablet, Take 10 mg by mouth daily., Disp: , Rfl:  .  rivastigmine (EXELON) 4.6 mg/24hr, 4.6  mg daily., Disp: , Rfl:   Allergies  Allergen Reactions  . Darvon [Propoxyphene]      ROS  As noted in HPI.   Physical Exam  BP (!) 147/71 (BP Location: Right Arm)   Pulse 72   Temp 98.1 F (36.7 C) (Oral)   Resp 16   SpO2 99%   Constitutional: Well developed, well nourished, no acute distress Eyes: PERRL, EOMI, conjunctiva normal bilaterally HENT: Normocephalic, atraumatic,mucus membranes moist.  No nasal congestion.  No maxillary, frontal sinus tenderness.  Normal oropharynx.  No cobblestoning or postnasal drip. Respiratory: Clear to auscultation bilaterally, no rales,  no wheezing, no rhonchi Cardiovascular: Normal rate and rhythm, no murmurs, no gallops, no rubs GI: Soft, nondistended, normal bowel sounds, nontender, no rebound, no guarding Back: no CVAT skin: No rash, skin intact Musculoskeletal: No edema, no tenderness, no deformities Neurologic: Alert & oriented x 3, CN III-XII grossly intact, no motor deficits, sensation grossly intact Psychiatric: Speech and behavior appropriate   ED Course   Medications - No data to display  Orders Placed This Encounter  Procedures  . SARS CORONAVIRUS 2 (TAT 6-24 HRS) Nasopharyngeal Nasopharyngeal Swab    Standing Status:   Standing    Number of Occurrences:   1    Order Specific Question:   Is this test for diagnosis or screening    Answer:   Screening    Order Specific Question:   Symptomatic for COVID-19 as defined by CDC    Answer:   No    Order Specific Question:   Hospitalized for COVID-19    Answer:   No    Order Specific Question:   Admitted to ICU for COVID-19    Answer:   No    Order Specific Question:   Previously tested for COVID-19    Answer:   No    Order Specific Question:   Resident in a congregate (group) care setting    Answer:   No    Order Specific Question:   Employed in healthcare setting    Answer:   No    Order Specific Question:   Pregnant    Answer:   No    Order Specific Question:   Has patient completed COVID vaccination(s) (2 doses of Pfizer/Moderna 1 dose of [employer])    Answer:   Yes  . Urinalysis, Complete w Microscopic    Standing Status:   Standing    Number of Occurrences:   1  . ED EKG    Standing Status:   Standing    Number of Occurrences:   1    Order Specific Question:   Reason for Exam    Answer:   Weakness   Results for orders placed or performed during the hospital encounter of 07/05/20 (from the past 24 hour(s))  Urinalysis, Complete w Microscopic     Status: Abnormal   Collection Time: 07/05/20  5:19 PM  Result Value Ref Range   Color,  Urine YELLOW YELLOW   APPearance CLEAR CLEAR   Specific Gravity, Urine 1.010 1.005 - 1.030   pH 6.0 5.0 - 8.0   Glucose, UA NEGATIVE NEGATIVE mg/dL   Hgb urine dipstick NEGATIVE NEGATIVE   Bilirubin Urine NEGATIVE NEGATIVE   Ketones, ur NEGATIVE NEGATIVE mg/dL   Protein, ur NEGATIVE NEGATIVE mg/dL   Nitrite NEGATIVE NEGATIVE   Leukocytes,Ua NEGATIVE NEGATIVE   Squamous Epithelial / LPF 0-5 0 - 5   WBC, UA 0-5 0 - 5 WBC/hpf  RBC / HPF NONE SEEN 0 - 5 RBC/hpf   Bacteria, UA FEW (A) NONE SEEN  Troponin I (High Sensitivity)     Status: None   Collection Time: 07/05/20  5:19 PM  Result Value Ref Range   Troponin I (High Sensitivity) 4 <18 ng/L   DG Chest 2 View  Result Date: 07/03/2020 CLINICAL DATA:  Palpitations, tachycardia EXAM: CHEST - 2 VIEW COMPARISON:  None. FINDINGS: The heart size and mediastinal contours are within normal limits. Both lungs are clear. The visualized skeletal structures are unremarkable. IMPRESSION: No active cardiopulmonary disease. Electronically Signed   By: Randa Ngo M.D.   On: 07/03/2020 22:21    ED Clinical Impression  1. Viral upper respiratory tract infection   2. Weakness   3. Left bundle branch block (LBBB) on electrocardiogram   4. Encounter for laboratory testing for COVID-19 virus      ED Assessment/Plan  ED labs, CXR reviewed.  As noted in HPI.  Will repeat  EKG, UA, COVID PCR sent.  Vitals normal, lungs clear, satting 100% on room air.  Will not repeat chest x-ry or lab work.  Abdomen soft and benign.   EKG: Normal sinus rhythm, sinus arrhythmia, rate 66.  Left axis deviation.  Left bundle branch block.  This was present on EKG from 07/04/2020.  There are no other EKGs to compare this with.  Husband says that he thinks that she may have had a left bundle branch previously however I am unable to prove this. HEART score 4-moderate probability of serious cardiac event within 30 days.  Do not know if the left bundle branch block is new or  not.  she has had symptoms for 2 days.  She already had an initial troponin in the ER 2 days ago, which was negative.  Will repeat troponin here, and if positive send her to the ED, however if negative, feel that the likelihood of ACS is fairly low.  UA negative for UTI. Troponin negative. Presentation c/w URI.  Home with Flonase, saline nasal irrigation.  Unsure as to cause of generalized weakness.  Follow up with PCP and cardiology for the left bundle branch block.  Discussed with patient and husband that she was at moderate risk for a cardiac event due to age and EKG but that today symptoms do not seem to be a cardiac cause.  They will follow up with cardiology.  COVID negative.  Discussed labs, MDM, treatment plan, and plan for follow-up with patient and husband discussed sn/sx that should prompt return to the ED. They agree with plan.   Meds ordered this encounter  Medications  . fluticasone (FLONASE) 50 MCG/ACT nasal spray    Sig: Place 2 sprays into both nostrils daily.    Dispense:  16 g    Refill:  0    *This clinic note was created using [employer]. Therefore, there may be occasional mistakes despite careful proofreading.  ?    Melynda Ripple, MD 07/06/20 0045    Melynda Ripple, MD 07/06/20 [phone]

## 2020-07-05 NOTE — Discharge Instructions (Addendum)
We will call you if your Covid test comes back positive.  Your second troponin was negative, so I think that while you are at moderate risk for cardiac event in the future, the cause of your symptoms today is not due to a heart attack.  Try some saline nasal irrigation with a Milta Deiters med rinse and distilled water as often as you want.  Flonase to help with the nasal congestion and postnasal drip.  May take Tylenol and ibuprofen as needed for body aches, headaches.  Follow-up with your primary care physician in 3 days if you are not getting any better.  Go immediately to the ER for chest pain, pressure, heaviness, if your weakness gets worse especially with walking, if you have any concerns.

## 2020-07-06 LAB — SARS CORONAVIRUS 2 (TAT 6-24 HRS): SARS Coronavirus 2: NEGATIVE

## 2021-04-08 ENCOUNTER — Other Ambulatory Visit: Payer: Self-pay

## 2021-04-08 ENCOUNTER — Emergency Department: Payer: Medicare Other

## 2021-04-08 ENCOUNTER — Emergency Department
Admission: EM | Admit: 2021-04-08 | Discharge: 2021-04-08 | Disposition: A | Discharge status: Home / Self Care | Payer: Medicare Other | Attending: Emergency Medicine | Admitting: Emergency Medicine

## 2021-04-08 ENCOUNTER — Encounter: Payer: Self-pay | Admitting: Emergency Medicine

## 2021-04-08 DIAGNOSIS — S8992XA Unspecified injury of left lower leg, initial encounter: Secondary | ICD-10-CM

## 2021-04-08 DIAGNOSIS — Z7982 Long term (current) use of aspirin: Secondary | ICD-10-CM | POA: Insufficient documentation

## 2021-04-08 DIAGNOSIS — F0390 Unspecified dementia without behavioral disturbance: Secondary | ICD-10-CM | POA: Insufficient documentation

## 2021-04-08 DIAGNOSIS — Y9301 Activity, walking, marching and hiking: Secondary | ICD-10-CM | POA: Diagnosis not present

## 2021-04-08 DIAGNOSIS — Y9389 Activity, other specified: Secondary | ICD-10-CM | POA: Diagnosis not present

## 2021-04-08 DIAGNOSIS — W541XXA Struck by dog, initial encounter: Secondary | ICD-10-CM | POA: Diagnosis not present

## 2021-04-08 HISTORY — DX: Unspecified dementia without behavioral disturbance: F03.90

## 2021-04-08 MED ORDER — ACETAMINOPHEN 325 MG PO TABS: 650.0000 mg | ORAL_TABLET | Freq: Once | ORAL | Status: DC

## 2021-04-08 MED ORDER — TRAMADOL HCL 50 MG PO TABS: 50.0000 mg | ORAL_TABLET | Freq: Four times a day (QID) | ORAL | 0 refills | Status: AC | PRN

## 2021-04-08 NOTE — Discharge Instructions (Addendum)
We suspect that you may have an injury to the meniscus (cartilage) or ligaments of the knee.  Keep the immobilizer on at all times except when bathing until you follow-up with orthopedics.  Make an appointment to follow-up with the orthopedist in 1 week.  Return to the ER for new, worsening, or persistent severe pain, numbness, weakness, inability to bear weight, or any other new or worsening symptoms that concern you.

## 2021-04-08 NOTE — ED Provider Notes (Signed)
[facility] Provider Note ____________________________________________   Event Date/Time   First MD Initiated Contact with Patient 04/08/21 1700     (approximate)  I have reviewed the triage vital signs and the nursing notes.   HISTORY  Chief Complaint Fall  Level 5 caveat: History present illness limited due to dementia  HPI Allison Ramos is a 80 y.o. female with a history of dementia who presents with left leg injury after fall.  Per the patient, she fell while playing with her dog.  EMS reported that the patient had apparently tripped over the dog.  She reports pain but also numbness to the left lower leg and states she is unable to put weight on it.  She denies hitting her head and had no LOC.  She denies other injuries.  Past Medical History:  Diagnosis Date  . Dementia ([place])     There are no problems to display for this patient.   History reviewed. No pertinent surgical history.  Prior to Admission medications   Medication Sig Start Date End Date Taking? Authorizing Provider  acetaminophen (TYLENOL) 500 MG tablet Take 500 mg by mouth daily.   Yes [provider]  ASPIRIN 81 PO Take 81 mg by mouth daily.   Yes [provider]  atorvastatin (LIPITOR) 40 MG tablet Take 40 mg by mouth daily. 02/28/21  Yes [provider]  Boswellia-Glucosamine-Vit D (OSTEO BI-FLEX ONE PER DAY) TABS Take 1 tablet by mouth daily.   Yes [provider]  cholecalciferol (VITAMIN D3) 25 MCG (1000 UNIT) tablet Take 2,000 Units by mouth daily.   Yes [provider]  famotidine (PEPCID) 20 MG tablet Take 20 mg by mouth at bedtime.   Yes [provider]  fluticasone (FLONASE) 50 MCG/ACT nasal spray Place 2 sprays into both nostrils daily as needed for allergies. 11/24/20  Yes [provider]  Lutein 6 MG CAPS Take 6 mg by mouth daily.   Yes [provider]  memantine (NAMENDA) 10  MG tablet Take 10 mg by mouth 2 (two) times daily. 02/22/21  Yes [provider]  metoprolol succinate (TOPROL-XL) 25 MG 24 hr tablet Take 25 mg by mouth daily. 01/25/21  Yes [provider]  montelukast (SINGULAIR) 10 MG tablet Take 30 mg by mouth daily. 02/03/21  Yes [provider]  pantoprazole (PROTONIX) 20 MG tablet Take 20 mg by mouth daily. 02/28/21  Yes [provider]  SYNTHROID 50 MCG tablet Take 50 mcg by mouth daily. 02/15/21  Yes [provider]  traMADol (ULTRAM) 50 MG tablet Take 1 tablet (50 mg total) by mouth every 6 (six) hours as needed for up to 5 days for severe pain. 04/08/21 04/13/21 Yes Arta Silence, MD  eletriptan (RELPAX) 40 MG tablet Take 40 mg by mouth daily as needed for migraine.    [provider]  rivastigmine (EXELON) 4.6 mg/24hr Place 4.6 mg onto the skin daily. Patient not taking: Reported on 04/08/2021 11/24/20   [provider]    Allergies Patient has no allergy information on record.  History reviewed. No pertinent family history.  Social History Social History   Tobacco Use  . Smoking status: Never Smoker  . Smokeless tobacco: Never Used  Substance Use Topics  . Alcohol use: Not Currently  . Drug use: Never    Review of Systems Level 5 COVID: Review of systems limited due to dementia  ENT: No neck pain. Cardiovascular: Denies chest pain. Gastrointestinal: No  vomiting. Musculoskeletal: Negative for back pain.  Positive for left leg pain. Neurological: Negative for headache.   ____________________________________________   PHYSICAL EXAM:  VITAL SIGNS: ED Triage Vitals  Enc Vitals Group     BP 04/08/21 1657 (!) 162/66     Pulse Rate 04/08/21 1657 65     Resp 04/08/21 1657 18     Temp 04/08/21 1657 98.3 F (36.8 C)     Temp Source 04/08/21 1657 Oral     SpO2 04/08/21 1652 99 %     Weight 04/08/21 1658 169 lb 4.8 oz (76.8 kg)     Height 04/08/21 1658 5\' 8"  (1.727 m)      Head Circumference --      Peak Flow --      Pain Score 04/08/21 1658 7     Pain Loc --      Pain Edu? --      Excl. in [place]? --     Constitutional: Alert, oriented x2. Anxious appearing but in no acute distress. Eyes: Conjunctivae are normal.  Head: Atraumatic. Nose: No congestion/rhinnorhea. Mouth/Throat: Mucous membranes are moist.   Neck: Normal range of motion.  No midline cervical spinal tenderness. Cardiovascular: Normal rate, regular rhythm. Good peripheral circulation. Respiratory: Normal respiratory effort.  No retractions. Gastrointestinal: No distention.  Musculoskeletal: No lower extremity edema.  Extremities warm and well perfused.  Left lower extremity with full range of motion at left hip.  Range of motion ankle and knee limited slightly due to pain.  Extensor mechanism intact.  Mild tenderness to the ankle.  No deformity to the left lower extremity.  2+ DP pulse.  Cap refill less than 2 seconds. Neurologic:  Normal speech and language. No gross focal neurologic deficits are appreciated.  Motor and sensory intact in bilateral lower extremities Skin:  Skin is warm and dry. No rash noted. Psychiatric: Anxious appearing.  ____________________________________________   LABS (all labs ordered are listed, but only abnormal results are displayed)  Labs Reviewed - No data to display ____________________________________________  EKG   ____________________________________________  RADIOLOGY  XR L tib/fib: No acute fracture CT lumbar spine: No acute fracture  ____________________________________________   PROCEDURES  Procedure(s) performed: No  Procedures  Critical Care performed: No ____________________________________________   INITIAL IMPRESSION / ASSESSMENT AND PLAN / ED COURSE  Pertinent labs & imaging results that were available during my care of the patient were reviewed by me and considered in my medical decision making (see chart for  details).  80 year old female with PMH as noted above presents after an apparent mechanical fall when she tripped while playing with her dog.  It does not appear that the patient hit her head or had LOC.  She primarily complains of pain but also apparently numbness to the left lower extremity from the knee down.  On exam, the patient is anxious but comfortable appearing.  Her vital signs are normal except for hypertension.  She is able to flex and extend the knee and ankle and there is no deformity.  She does have some pain on range of motion.  Good distal pulses in the LLE.  Although the patient reports "numbness" it seems that her symptoms are more akin to tingling, as she does have sensation to light touch as well as pain throughout the left lower extremity.  There are no other neurologic symptoms or deficits.  We will obtain x-rays of the left tibia/fibula to rule out fracture.  It is also possible that the patient could be  having a neuropraxia or radiculopathy/peripheral neuropathy related to the fall.  There is no evidence of spinal cord injury.  ----------------------------------------- 8:21 PM on 04/08/2021 -----------------------------------------  The x-ray is negative.  On reassessment, I examined the patient further and had her bring her legs over the side of the stretcher to better pinpoint exactly where her pain and symptoms are.  It appears that her only significant pain at this time is in the lateral aspect of the knee and she has some mild swelling there.  She is able to range the knee with some pain.  The extensor mechanism is still intact.  She also reports some lumbar spinal pain now.  Overall presentation is consistent with a meniscal or ligamentous injury to the left knee.  She continues to have no actual neurologic deficits of the left leg.  I obtained a CT to rule out lumbar spine injury, and it is negative for acute traumatic findings.  At this time, the patient is stable  for discharge home.  I counseled her and her husband on the results of the imaging.  We have placed the patient in a knee immobilizer and the plan will be for orthopedic follow-up.  Return precautions provided, and they expressed understanding.  ____________________________________________   FINAL CLINICAL IMPRESSION(S) / ED DIAGNOSES  Final diagnoses:  Knee injuries, left, initial encounter      NEW MEDICATIONS STARTED DURING THIS VISIT:  Discharge Medication List as of 04/08/2021  8:45 PM    START taking these medications   Details  traMADol (ULTRAM) 50 MG tablet Take 1 tablet (50 mg total) by mouth every 6 (six) hours as needed for up to 5 days for severe pain., Starting Sat 04/08/2021, Until Thu 04/13/2021 at 2359, Normal         Note:  This document was prepared using Dragon voice recognition software and may include unintentional dictation errors.    Arta Silence, MD 04/09/21 [phone]

## 2021-04-08 NOTE — ED Triage Notes (Signed)
Pt via Ems from home. Per EMS patient was walking dog and got tripped over dog and fell. Pt denies hitting head or losing consciousness. Pt complains of left leg pain that radiates from the knee down to the toes. Pt also complains of numbness to leg but able to feel touch. Pt able to lift leg off bed. strong pulses in foot. Pt denies being on any blood thinners.

## 2021-09-29 ENCOUNTER — Ambulatory Visit: Payer: Medicare Other

## 2021-09-29 ENCOUNTER — Other Ambulatory Visit: Payer: Self-pay | Admitting: Family Medicine

## 2021-09-29 DIAGNOSIS — M5416 Radiculopathy, lumbar region: Secondary | ICD-10-CM

## 2021-10-01 ENCOUNTER — Encounter: Payer: Self-pay | Admitting: Emergency Medicine

## 2021-10-01 ENCOUNTER — Emergency Department
Admission: EM | Admit: 2021-10-01 | Discharge: 2021-10-01 | Disposition: A | Discharge status: Home / Self Care | Payer: Medicare Other | Attending: Emergency Medicine | Admitting: Emergency Medicine

## 2021-10-01 ENCOUNTER — Ambulatory Visit: Admit: 2021-10-01 | Discharge status: Absent without leave | Payer: Medicare Other

## 2021-10-01 ENCOUNTER — Other Ambulatory Visit: Payer: Self-pay

## 2021-10-01 ENCOUNTER — Emergency Department: Payer: Medicare Other

## 2021-10-01 DIAGNOSIS — Z79899 Other long term (current) drug therapy: Secondary | ICD-10-CM | POA: Insufficient documentation

## 2021-10-01 DIAGNOSIS — F0390 Unspecified dementia without behavioral disturbance: Secondary | ICD-10-CM | POA: Diagnosis not present

## 2021-10-01 DIAGNOSIS — R4182 Altered mental status, unspecified: Secondary | ICD-10-CM

## 2021-10-01 DIAGNOSIS — N390 Urinary tract infection, site not specified: Secondary | ICD-10-CM | POA: Diagnosis not present

## 2021-10-01 DIAGNOSIS — R410 Disorientation, unspecified: Secondary | ICD-10-CM | POA: Diagnosis not present

## 2021-10-01 DIAGNOSIS — Z7982 Long term (current) use of aspirin: Secondary | ICD-10-CM | POA: Diagnosis not present

## 2021-10-01 LAB — COMPREHENSIVE METABOLIC PANEL
ALT: 16 U/L (ref 0–44)
AST: 25 U/L (ref 15–41)
Albumin: 4.4 g/dL (ref 3.5–5.0)
Alkaline Phosphatase: 119 U/L (ref 38–126)
Anion gap: 9 (ref 5–15)
BUN: 9 mg/dL (ref 8–23)
CO2: 26 mmol/L (ref 22–32)
Calcium: 9.3 mg/dL (ref 8.9–10.3)
Chloride: 106 mmol/L (ref 98–111)
Creatinine, Ser: 0.6 mg/dL (ref 0.44–1.00)
GFR, Estimated: >60 mL/min (ref >60)
Glucose, Bld: 98 mg/dL (ref 70–99)
Potassium: 3.2 mmol/L — ABNORMAL LOW (ref 3.5–5.1)
Sodium: 141 mmol/L (ref 135–145)
Total Bilirubin: 1.4 mg/dL — ABNORMAL HIGH (ref 0.3–1.2)
Total Protein: 7.3 g/dL (ref 6.5–8.1)

## 2021-10-01 LAB — URINALYSIS, COMPLETE (UACMP) WITH MICROSCOPIC
Bilirubin Urine: NEGATIVE
Glucose, UA: NEGATIVE mg/dL
Hgb urine dipstick: NEGATIVE
Ketones, ur: 5 mg/dL — AB
Nitrite: POSITIVE — AB
Protein, ur: NEGATIVE mg/dL
Specific Gravity, Urine: 1.006 (ref 1.005–1.030)
WBC, UA: >50 WBC/hpf — ABNORMAL HIGH (ref 0–5)
pH: 9 — ABNORMAL HIGH (ref 5.0–8.0)

## 2021-10-01 LAB — CBC
HCT: 42.4 % (ref 36.0–46.0)
Hemoglobin: 15 g/dL (ref 12.0–15.0)
MCH: 30.4 pg (ref 26.0–34.0)
MCHC: 35.4 g/dL (ref 30.0–36.0)
MCV: 85.8 fL (ref 80.0–100.0)
Platelets: 183 10*3/uL (ref 150–400)
RBC: 4.94 MIL/uL (ref 3.87–5.11)
RDW: 12.6 % (ref 11.5–15.5)
WBC: 5.2 10*3/uL (ref 4.0–10.5)
nRBC: 0 % (ref 0.0–0.2)

## 2021-10-01 LAB — TROPONIN I (HIGH SENSITIVITY): Troponin I (High Sensitivity): 5 ng/L (ref <18)

## 2021-10-01 MED ORDER — CEPHALEXIN 500 MG PO CAPS: 500.0000 mg | ORAL_CAPSULE | Freq: Two times a day (BID) | ORAL | 0 refills | Status: AC

## 2021-10-01 NOTE — ED Triage Notes (Addendum)
Pt via POV from home. Pt recently had a fall recently. Pt was seen at [facility] to evaluate her back pain. Per husband, pt has a hx of dementia but "it comes and goes, some days are good and some are bad." Pt c/o headache since yesterday but getting progressively worse. Husband states yesterday pt became more anxious and more confused than normal.   Pt is alert but oriented place only. Pt seems very anxious on arrival at this time. Attempted to call daughter at this time, per husband, daughter will have a better story but failed attempted.

## 2021-10-01 NOTE — ED Provider Notes (Signed)
[facility] Emergency Department Provider Note ____________________________________________   Event Date/Time   First MD Initiated Contact with Patient 10/01/21 1351     (approximate)  I have reviewed the triage vital signs and the nursing notes.   HISTORY  Chief Complaint Altered Mental Status  Level 5 COVID: History of present illness limited due to dementia  HPI Allison Ramos is a 80 y.o. female with PMH as noted below who presents with increased anxiety and confusion over the last 2 weeks after she fell and hurt her lower back.  The patient was eval by orthopedics and had x-rays and subsequently went to PM&R for evaluation, but has not had a CT of her lumbar spine.  The patient's daughter states that since that time she has been in pain and increasingly anxious and agitated, and that she believes that the pain is affecting her cognition.  The patient has tried an antipsychotic and antidepressant with unacceptable side effects.  She has been taking Tylenol and tramadol intermittently for pain with reasonable relief.  The patient herself endorses low back pain and appears very anxious but denies other acute symptoms.  Past Medical History:  Diagnosis Date   Dementia ([place])    Hyperlipidemia    Osteoarthrosis     There are no problems to display for this patient.   Past Surgical History:  Procedure Laterality Date   ABDOMINAL HYSTERECTOMY      Prior to Admission medications   Medication Sig Start Date End Date Taking? Authorizing Provider  cephALEXin (KEFLEX) 500 MG capsule Take 1 capsule (500 mg total) by mouth 2 (two) times daily for 7 days. 10/01/21 10/08/21 Yes Arta Silence, MD  acetaminophen (TYLENOL) 500 MG tablet Take 500 mg by mouth daily.    [provider]  aspirin 81 MG chewable tablet Chew by mouth daily.    [provider]  ASPIRIN 81 PO Take 81 mg by mouth daily.    [provider]  atorvastatin  (LIPITOR) 40 MG tablet Take 40 mg by mouth daily.    [provider]  atorvastatin (LIPITOR) 40 MG tablet Take 40 mg by mouth daily. 02/28/21   [provider]  Boswellia-Glucosamine-Vit D (OSTEO BI-FLEX ONE PER DAY) TABS Take 1 tablet by mouth daily.    [provider]  cholecalciferol (VITAMIN D3) 25 MCG (1000 UNIT) tablet Take 2,000 Units by mouth daily.    [provider]  eletriptan (RELPAX) 40 MG tablet Take 40 mg by mouth daily as needed for migraine.    [provider]  famotidine (PEPCID) 20 MG tablet Take 20 mg by mouth at bedtime.    [provider]  fluconazole (DIFLUCAN) 150 MG tablet Take 1 tablet (150 mg) PO x 1 dose. May repeat 150 mg dose in 3 days if still symptomatic. 05/17/20   Karen Kitchens, NP  fluticasone (FLONASE) 50 MCG/ACT nasal spray Place 2 sprays into both nostrils daily. 07/05/20   Melynda Ripple, MD  fluticasone (FLONASE) 50 MCG/ACT nasal spray Place 2 sprays into both nostrils daily as needed for allergies. 11/24/20   [provider]  Glucosamine-Chondroit-Vit C-Mn (GLUCOSAMINE 1500 COMPLEX PO) Take 2 capsules by mouth daily.    [provider]  levocetirizine (XYZAL) 5 MG tablet Take 5 mg by mouth daily. 06/07/20   [provider]  levothyroxine (SYNTHROID, LEVOTHROID) 50 MCG tablet Take 50 mcg by mouth daily before breakfast.    [provider]  Lutein 6 MG CAPS Take  6 mg by mouth daily.    [provider]  Lutein 6 MG TABS Take 1 tablet by mouth daily.    [provider]  memantine (NAMENDA) 10 MG tablet Take 10 mg by mouth 2 (two) times daily. 02/22/21   [provider]  memantine (NAMENDA) 5 MG tablet Take 10 mg by mouth 2 (two) times daily.     [provider]  metoprolol succinate (TOPROL-XL) 25 MG 24 hr tablet Take 25 mg by mouth daily. 01/25/21   [provider]  montelukast (SINGULAIR) 10 MG tablet Take 10 mg by mouth daily. 05/09/20    [provider]  montelukast (SINGULAIR) 10 MG tablet Take 30 mg by mouth daily. 02/03/21   [provider]  pantoprazole (PROTONIX) 20 MG tablet Take 20 mg by mouth daily. 02/28/21   [provider]  rivastigmine (EXELON) 4.6 mg/24hr 4.6 mg daily. 06/14/20   [provider]  rivastigmine (EXELON) 4.6 mg/24hr Place 4.6 mg onto the skin daily. Patient not taking: Reported on 04/08/2021 11/24/20   [provider]  SYNTHROID 50 MCG tablet Take 50 mcg by mouth daily. 02/15/21   [provider]    Allergies Darvon [propoxyphene]  History reviewed. No pertinent family history.  Social History Social History   Tobacco Use   Smoking status: Never   Smokeless tobacco: Never  Vaping Use   Vaping Use: Never used  Substance Use Topics   Alcohol use: Not Currently   Drug use: Never    Review of Systems Level 5 caveat: Unable to obtain review of systems due to dementia    ____________________________________________   PHYSICAL EXAM:  VITAL SIGNS: ED Triage Vitals  Enc Vitals Group     BP 10/01/21 1312 (!) 153/81     Pulse Rate 10/01/21 1312 73     Resp 10/01/21 1312 18     Temp 10/01/21 1312 98.3 F (36.8 C)     Temp Source 10/01/21 1312 Oral     SpO2 10/01/21 1312 97 %     Weight 10/01/21 1317 155 lb (70.3 kg)     Height 10/01/21 1317 5\' 9"  (1.753 m)     Head Circumference --      Peak Flow --      Pain Score --      Pain Loc --      Pain Edu? --      Excl. in [place]? --     Constitutional: Alert, oriented to person and place but generally confused.  Well appearing for age. Eyes: Conjunctivae are normal.  EOMI.  PERRLA. Head: Atraumatic. Nose: No congestion/rhinnorhea. Mouth/Throat: Mucous membranes are moist.   Neck: Normal range of motion.  Cardiovascular: Normal rate, regular rhythm. Grossly normal heart sounds.  Good peripheral circulation. Respiratory: Normal respiratory effort.  No retractions. Lungs  CTAB. Gastrointestinal: Soft and nontender. No distention.  Genitourinary: No flank tenderness. Musculoskeletal: No lower extremity edema.  Extremities warm and well perfused.  Mild lumbar midline and paraspinal tenderness with no step-off or crepitus. Neurologic:  Normal speech and language.  Normal gait.  Motor intact in all extremities.  Normal coordination. Skin:  Skin is warm and dry. No rash noted. Psychiatric: Anxious appearing.  ____________________________________________   LABS (all labs ordered are listed, but only abnormal results are displayed)  Labs Reviewed  COMPREHENSIVE METABOLIC PANEL - Abnormal; Notable for the following components:      Result Value   Potassium 3.2 (*)    Total Bilirubin 1.4 (*)  All other components within normal limits  URINALYSIS, COMPLETE (UACMP) WITH MICROSCOPIC - Abnormal; Notable for the following components:   Color, Urine YELLOW (*)    APPearance HAZY (*)    pH 9.0 (*)    Ketones, ur 5 (*)    Nitrite POSITIVE (*)    Leukocytes,Ua MODERATE (*)    WBC, UA >50 (*)    Bacteria, UA MANY (*)    All other components within normal limits  CBC  CBG MONITORING, ED  TROPONIN I (HIGH SENSITIVITY)  TROPONIN I (HIGH SENSITIVITY)   ____________________________________________  EKG  ED ECG REPORT I, Arta Silence, the attending physician, personally viewed and interpreted this ECG.  Date: 10/01/2021 EKG Time: 1318 Rate: 90 Rhythm: normal sinus rhythm QRS Axis: Left axis Intervals: LBBB ST/T Wave abnormalities: normal Narrative Interpretation: no evidence of acute ischemia; no significant change when compared to EKG of 04/08/2021  ____________________________________________  RADIOLOGY  CT head: IMPRESSION:  Atrophy with small vessel chronic ischemic changes of deep cerebral  white matter.     No acute intracranial abnormalities.   CT lumbar spine:  Pending  ____________________________________________   PROCEDURES  Procedure(s) performed: No  Procedures  Critical Care performed: No ____________________________________________   INITIAL IMPRESSION / ASSESSMENT AND PLAN / ED COURSE  Pertinent labs & imaging results that were available during my care of the patient were reviewed by me and considered in my medical decision making (see chart for details).   80 year old female with PMH as noted above including a history of dementia presents with increased confusion, anxiety, and intermittent agitation over the last 2 weeks after sustaining a mechanical fall and hitting her head and lower back.  On exam, the patient is overall well-appearing for her age.  She is alert, oriented x2, but confused and anxious appearing.  Her vital signs are normal except for hypertension.  Neurologic exam is nonfocal.  Overall presentation is consistent with likely progression of her dementia and probably a significant component of increased pain which is exacerbating her dementia symptoms.  I have a low suspicion for acute CNS cause of the change in mental status.  CT head was obtained from triage and is negative for acute findings.  I have ordered a CT of the lumbar spine to evaluate for subacute fracture.  Differential for the altered mental status also includes dehydration, electrolyte abnormalities, other metabolic cause such as uremia, UTI or other infection, or less likely cardiac cause.  We will obtain lab work-up including urinalysis and reassess.  ----------------------------------------- 3:32 PM on 10/01/2021 -----------------------------------------  Urinalysis is consistent with a UTI.  This certainly could explain the patient's recent change in mental status.  We will give a prescription for Keflex.  The other labs are unremarkable.  I talked further with the daughter over FaceTime.  She does not want to wait for the read of the lumbar spine  CT, given that the patient had negative x-rays recently.  The patient is ambulating without difficulty so I think that this is reasonable.  The daughter states that they will follow-up with the results on MyChart or via the patient's PMD.  She understands that without waiting for the read we cannot fully rule out a lumbar compression fracture or other possible cause of acute pain which could require urgent treatment.  I gave the daughter thorough return precautions and she expressed understanding.   ____________________________________________   FINAL CLINICAL IMPRESSION(S) / ED DIAGNOSES  Final diagnoses:  Altered mental status, unspecified altered mental status type  Urinary tract infection without hematuria, site unspecified      NEW MEDICATIONS STARTED DURING THIS VISIT:  New Prescriptions   CEPHALEXIN (KEFLEX) 500 MG CAPSULE    Take 1 capsule (500 mg total) by mouth 2 (two) times daily for 7 days.     Note:  This document was prepared using Dragon voice recognition software and may include unintentional dictation errors.    Arta Silence, MD 10/01/21 1534

## 2021-10-01 NOTE — ED Notes (Signed)
Spoke with pt's daughter, pt is gets triggered when the words "dementia and Alzheimer" trigger her and she becomes uncooperative and upset.

## 2021-10-01 NOTE — Discharge Instructions (Addendum)
Give the antibiotic as prescribed and finish the full 1 week course.  Return to the emergency department for any new, worsening, or persistent severe anxiety, confusion, delirium, weakness, high fever, vomiting or inability to take the medication, or any other new or worsening symptoms that are concerning.

## 2021-10-01 NOTE — ED Notes (Signed)
RN to bedside. Pt is upset and crying. Unable to answer questions other than her name.   MD at bedside.

## 2021-12-13 ENCOUNTER — Inpatient Hospital Stay
Admission: EM | Admit: 2021-12-13 | Discharge: 2021-12-21 | DRG: 956 | Disposition: A | Discharge status: Skilled Nursing Facility | Payer: Medicare Other | Attending: Internal Medicine | Admitting: Internal Medicine

## 2021-12-13 ENCOUNTER — Emergency Department: Payer: Medicare Other

## 2021-12-13 DIAGNOSIS — E785 Hyperlipidemia, unspecified: Secondary | ICD-10-CM | POA: Diagnosis present

## 2021-12-13 DIAGNOSIS — Z419 Encounter for procedure for purposes other than remedying health state, unspecified: Secondary | ICD-10-CM

## 2021-12-13 DIAGNOSIS — F0280 Dementia in other diseases classified elsewhere without behavioral disturbance: Secondary | ICD-10-CM | POA: Diagnosis present

## 2021-12-13 DIAGNOSIS — Z9181 History of falling: Secondary | ICD-10-CM

## 2021-12-13 DIAGNOSIS — Z90710 Acquired absence of both cervix and uterus: Secondary | ICD-10-CM

## 2021-12-13 DIAGNOSIS — F0390 Unspecified dementia without behavioral disturbance: Secondary | ICD-10-CM | POA: Diagnosis present

## 2021-12-13 DIAGNOSIS — M25552 Pain in left hip: Secondary | ICD-10-CM | POA: Diagnosis not present

## 2021-12-13 DIAGNOSIS — S72002A Fracture of unspecified part of neck of left femur, initial encounter for closed fracture: Principal | ICD-10-CM | POA: Diagnosis present

## 2021-12-13 DIAGNOSIS — F02818 Dementia in other diseases classified elsewhere, unspecified severity, with other behavioral disturbance: Secondary | ICD-10-CM | POA: Diagnosis not present

## 2021-12-13 DIAGNOSIS — T8182XA Emphysema (subcutaneous) resulting from a procedure, initial encounter: Secondary | ICD-10-CM | POA: Diagnosis present

## 2021-12-13 DIAGNOSIS — Z79899 Other long term (current) drug therapy: Secondary | ICD-10-CM

## 2021-12-13 DIAGNOSIS — F0282 Dementia in other diseases classified elsewhere, unspecified severity, with psychotic disturbance: Secondary | ICD-10-CM | POA: Diagnosis not present

## 2021-12-13 DIAGNOSIS — S32512A Fracture of superior rim of left pubis, initial encounter for closed fracture: Secondary | ICD-10-CM

## 2021-12-13 DIAGNOSIS — E039 Hypothyroidism, unspecified: Secondary | ICD-10-CM | POA: Diagnosis present

## 2021-12-13 DIAGNOSIS — S32592A Other specified fracture of left pubis, initial encounter for closed fracture: Secondary | ICD-10-CM

## 2021-12-13 DIAGNOSIS — F05 Delirium due to known physiological condition: Secondary | ICD-10-CM | POA: Diagnosis not present

## 2021-12-13 DIAGNOSIS — R296 Repeated falls: Secondary | ICD-10-CM

## 2021-12-13 DIAGNOSIS — G309 Alzheimer's disease, unspecified: Secondary | ICD-10-CM | POA: Diagnosis present

## 2021-12-13 DIAGNOSIS — M1612 Unilateral primary osteoarthritis, left hip: Secondary | ICD-10-CM | POA: Diagnosis present

## 2021-12-13 DIAGNOSIS — D472 Monoclonal gammopathy: Secondary | ICD-10-CM | POA: Diagnosis present

## 2021-12-13 DIAGNOSIS — G9341 Metabolic encephalopathy: Secondary | ICD-10-CM | POA: Diagnosis present

## 2021-12-13 DIAGNOSIS — F01518 Vascular dementia, unspecified severity, with other behavioral disturbance: Secondary | ICD-10-CM | POA: Diagnosis present

## 2021-12-13 DIAGNOSIS — W19XXXA Unspecified fall, initial encounter: Secondary | ICD-10-CM | POA: Diagnosis present

## 2021-12-13 DIAGNOSIS — Z885 Allergy status to narcotic agent status: Secondary | ICD-10-CM

## 2021-12-13 DIAGNOSIS — G8918 Other acute postprocedural pain: Secondary | ICD-10-CM

## 2021-12-13 DIAGNOSIS — R52 Pain, unspecified: Secondary | ICD-10-CM

## 2021-12-13 DIAGNOSIS — K219 Gastro-esophageal reflux disease without esophagitis: Secondary | ICD-10-CM | POA: Diagnosis present

## 2021-12-13 DIAGNOSIS — S60022A Contusion of left index finger without damage to nail, initial encounter: Secondary | ICD-10-CM | POA: Diagnosis present

## 2021-12-13 DIAGNOSIS — Z79890 Hormone replacement therapy (postmenopausal): Secondary | ICD-10-CM

## 2021-12-13 DIAGNOSIS — E119 Type 2 diabetes mellitus without complications: Secondary | ICD-10-CM | POA: Diagnosis present

## 2021-12-13 DIAGNOSIS — F0152 Vascular dementia, unspecified severity, with psychotic disturbance: Secondary | ICD-10-CM | POA: Diagnosis not present

## 2021-12-13 DIAGNOSIS — Y92009 Unspecified place in unspecified non-institutional (private) residence as the place of occurrence of the external cause: Secondary | ICD-10-CM

## 2021-12-13 DIAGNOSIS — Z886 Allergy status to analgesic agent status: Secondary | ICD-10-CM

## 2021-12-13 DIAGNOSIS — I10 Essential (primary) hypertension: Secondary | ICD-10-CM | POA: Diagnosis present

## 2021-12-13 DIAGNOSIS — Z7982 Long term (current) use of aspirin: Secondary | ICD-10-CM

## 2021-12-13 DIAGNOSIS — D62 Acute posthemorrhagic anemia: Secondary | ICD-10-CM

## 2021-12-13 DIAGNOSIS — Z20822 Contact with and (suspected) exposure to covid-19: Secondary | ICD-10-CM | POA: Diagnosis present

## 2021-12-13 MED ORDER — ONDANSETRON HCL 4 MG/2ML IJ SOLN
4.0000 mg | Freq: Once | INTRAMUSCULAR | Status: AC
  Administered 2021-12-13: 4 mg via INTRAVENOUS
  Filled 2021-12-13: qty 2

## 2021-12-13 MED ORDER — FENTANYL CITRATE PF 50 MCG/ML IJ SOSY
50.0000 ug | PREFILLED_SYRINGE | Freq: Once | INTRAMUSCULAR | Status: AC
Start: 2021-12-13 — End: 2021-12-13
  Administered 2021-12-13: 50 ug via INTRAVENOUS
  Filled 2021-12-13: qty 1

## 2021-12-13 NOTE — ED Provider Notes (Signed)
[facility] Emergency Department Provider Note ____________________________________________   Event Date/Time   First MD Initiated Contact with Patient 12/13/21 2328     (approximate)  I have reviewed the triage vital signs and the nursing notes.   HISTORY  Chief Complaint No chief complaint on file.    HPI Allison Ramos is a 80 y.o. female with history of dementia, hyperlipidemia who presents to the emergency department with EMS after she had a fall.  Patient unable to provide any history.  No family currently at bedside.  Patient received 75 mcg of IV fentanyl in route with EMS.  Left lower extremity is shortened and externally rotated.  She has bruising to the left index finger and left elbow.  It appears based on her medication list she is on aspirin but does not appear to be on an anticoagulant.  They report her daughter is on her way to the emergency department.         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 Authorizing Provider  acetaminophen (TYLENOL) 500 MG tablet Take 500 mg by mouth every 6 (six) hours as needed.   Yes [provider]  ASPIRIN 81 PO Take 81 mg by mouth daily.   Yes [provider]  atorvastatin (LIPITOR) 40 MG tablet Take 40 mg by mouth daily. 02/28/21  Yes  [provider]  Boswellia-Glucosamine-Vit D (OSTEO BI-FLEX ONE PER DAY) TABS Take 1 tablet by mouth daily.   Yes [provider]  cholecalciferol (VITAMIN D3) 25 MCG (1000 UNIT) tablet Take 5,000 Units by mouth daily.   Yes [provider]  lidocaine (LIDODERM) 5 % Place 1 patch onto the skin daily. 09/13/21  Yes [provider]  Lutein 6 MG CAPS Take 6 mg by mouth daily.   Yes [provider]  memantine (NAMENDA) 10 MG tablet Take 10 mg by mouth 2 (two) times daily. 02/22/21  Yes [provider]  metoprolol succinate (TOPROL-XL) 50 MG 24 hr tablet Take 50 mg by mouth daily. 11/15/21  Yes [provider]  montelukast (SINGULAIR) 10 MG tablet Take 10 mg by mouth at bedtime. 02/03/21  Yes [provider]  pantoprazole (PROTONIX) 20 MG tablet Take 20 mg by mouth daily. 02/28/21  Yes [provider]  SYNTHROID 50 MCG tablet Take 50 mcg by mouth daily. 02/15/21  Yes [provider]  PREMARIN vaginal cream Place vaginally. Patient not taking: Reported on 12/14/2021 10/20/21   [provider]    Allergies Darvon [propoxyphene] and Oxycodone-acetaminophen  No family history on file.  Social History Social History   Tobacco Use   Smoking status: Never   Smokeless tobacco: Never  Vaping Use   Vaping Use: Never used  Substance Use Topics   Alcohol use: Not  Currently   Drug use: Never    Review of Systems Level 5 caveat secondary to altered mental status   ____________________________________________   PHYSICAL EXAM:  VITAL SIGNS: ED Triage Vitals  Enc Vitals Group     BP 12/13/21 2332 (!) 178/75     Pulse Rate 12/13/21 2332 61     Resp 12/13/21 2332 17     Temp 12/13/21 2332 98 F (36.7 C)     Temp Source 12/13/21 2332 Oral     SpO2 12/13/21 2332 94 %     Weight --      Height --      Head Circumference --      Peak Flow --      Pain Score 12/13/21 2334 7     Pain Loc --       Pain Edu? --      Excl. in GC? --    CONSTITUTIONAL: Alert and oriented to person and place but not year.  Not able to answer questions appropriately.  Elderly.  Does not appear in distress. HEAD: Normocephalic; atraumatic EYES: Conjunctivae clear, PERRL, EOMI ENT: normal nose; no rhinorrhea; moist mucous membranes; pharynx without lesions noted; no dental injury; no septal hematoma NECK: Supple, no meningismus, no LAD; no midline spinal tenderness, step-off or deformity; trachea midline CARD: RRR; S1 and S2 appreciated; no murmurs, no clicks, no rubs, no gallops RESP: Normal chest excursion without splinting or tachypnea; breath sounds clear and equal bilaterally; no wheezes, no rhonchi, no rales; no hypoxia or respiratory distress CHEST:  chest wall stable, no crepitus or ecchymosis or deformity, nontender to palpation; no flail chest ABD/GI: Normal bowel sounds; non-distended; soft, non-tender, no rebound, no guarding; no ecchymosis or other lesions noted PELVIS:  stable, nontender to palpation BACK:  The back appears normal and is diffusely tender throughout the thoracic and lumbar spine without step-off or deformity EXT: Tender to palpation over the left hip.  Left lower extremity is shortened and externally rotated.  2+ DP pulses bilaterally.  Compartments soft.  Also has tenderness over the left hand with associated bruising but no deformity to the left index finger.  Has some mild bruising and soft tissue swelling noted to the left posterior elbow with tenderness in this area as well.  She seems to have normal range of motion however in all joints of the left upper extremity.  Extremities warm and well-perfused. SKIN: Normal color for age and race; warm NEURO: Moves all extremities equally, no facial asymmetry, normal speech  PSYCH: The patient's mood and manner are appropriate. Grooming and personal hygiene are appropriate.  ____________________________________________   LABS (all labs  ordered are listed, but only abnormal results are displayed)  Labs Reviewed  CBC WITH DIFFERENTIAL/PLATELET - Abnormal; Notable for the following components:      Result Value   WBC 11.7 (*)    Neutro Abs 8.7 (*)    Abs Immature Granulocytes 0.08 (*)    All other components within normal limits  BASIC METABOLIC PANEL - Abnormal; Notable for the following components:   Potassium 3.2 (*)    Glucose, Bld 150 (*)    Calcium 8.7 (*)    All other components within normal limits  RESP PANEL BY RT-PCR (FLU A&B, COVID) ARPGX2  URINALYSIS, ROUTINE W REFLEX MICROSCOPIC  TROPONIN I (HIGH SENSITIVITY)  TROPONIN I (HIGH SENSITIVITY)   ____________________________________________  EKG    EKG Interpretation  Date/Time:  Wednesday December 13 2021 23:42:01 EST Ventricular Rate:  25  PR Interval:  205 QRS Duration: 161 QT Interval:  494 QTC Calculation: 502 R Axis:   -13 Text Interpretation: Sinus rhythm Left bundle branch block Baseline wander in lead(s) V2 No significant change since last tracing Confirmed by Pryor Curia [id number]) on 12/13/2021 11:52:24 PM        ____________________________________________  RADIOLOGY Jessie Foot Joselito Fieldhouse, personally viewed and evaluated these images (plain radiographs) as part of my medical decision making, as well as reviewing the written report by the radiologist.  ED MD interpretation: Imaging shows a left femoral neck fracture and superior and inferior pubic rami fractures and a left sacral alla fracture.  Official radiology report(s): DG Elbow Complete Left  Result Date: 12/13/2021 CLINICAL DATA:  Left elbow pain following fall, initial encounter EXAM: LEFT ELBOW - COMPLETE 3+ VIEW COMPARISON:  None. FINDINGS: Elevation of the anterior fat pad is noted. Posterior fat pad is within normal limits. This is consistent with small joint effusion. No acute fracture or dislocation is noted. No other soft tissue abnormality is noted. IMPRESSION: Small joint  effusion without definitive fracture. Electronically Signed   By: Inez Catalina M.D.   On: 12/13/2021 23:58   CT HEAD WO CONTRAST (5MM)  Result Date: 12/14/2021 CLINICAL DATA:  Status post fall. EXAM: CT HEAD WITHOUT CONTRAST TECHNIQUE: Contiguous axial images were obtained from the base of the skull through the vertex without intravenous contrast. COMPARISON:  October 01, 2021 FINDINGS: Brain: There is mild cerebral atrophy with widening of the extra-axial spaces and ventricular dilatation. There are areas of decreased attenuation within the white matter tracts of the supratentorial brain, consistent with microvascular disease changes. Vascular: No hyperdense vessel or unexpected calcification. Skull: Normal. Negative for acute fracture. Chronic deformities are seen involving the bilateral mandibular condyles. Sinuses/Orbits: No acute finding. Other: None. IMPRESSION: 1. Mild cerebral atrophy and microvascular disease changes of the supratentorial brain. 2. Chronic deformities are seen involving the bilateral mandibular condyles. 3. No acute intracranial abnormality. Electronically Signed   By: Virgina Norfolk M.D.   On: 12/14/2021 00:51   CT Cervical Spine Wo Contrast  Result Date: 12/14/2021 CLINICAL DATA:  Status post fall. EXAM: CT CERVICAL SPINE WITHOUT CONTRAST TECHNIQUE: Multidetector CT imaging of the cervical spine was performed without intravenous contrast. Multiplanar CT image reconstructions were also generated. COMPARISON:  None. FINDINGS: Alignment: Normal. Skull base and vertebrae: No acute fracture. Chronic deformities are seen along the anterior aspect of the bilateral mandibular condyles. Soft tissues and spinal canal: No prevertebral fluid or swelling. No visible canal hematoma. Disc levels: Mild endplate sclerosis is seen at the levels of C2-C3 and C3-C4. Moderate to marked severity endplate sclerosis is noted at the level of C6-C7. Fusion of the C4-C5 and C5-C6 intervertebral disc  spaces is seen. Moderate severity intervertebral disc space narrowing is seen at the level of C6-C7. Bilateral moderate severity multilevel facet joint hypertrophy is seen, left greater than right. Upper chest: Negative. Other: None. IMPRESSION: 1. No acute fracture or subluxation of the cervical spine. 2. Fusion of the C4-C5 and C5-C6 intervertebral disc spaces. 3. Moderate severity degenerative disc disease at the level of C6-C7. 4. Chronic deformities along the anterior aspect of the bilateral mandibular condyles. Electronically Signed   By: Virgina Norfolk M.D.   On: 12/14/2021 00:54   CT Thoracic Spine Wo Contrast  Result Date: 12/14/2021 CLINICAL DATA:  Initial evaluation for acute trauma, fall. EXAM: CT THORACIC SPINE WITHOUT CONTRAST TECHNIQUE: Multidetector CT images of the thoracic were obtained using the standard  protocol without intravenous contrast. COMPARISON:  None available. FINDINGS: Alignment: Physiologic with preservation of the normal thoracic kyphosis. No listhesis. Vertebrae: Vertebral body height maintained without acute or chronic fracture. Visualized ribs intact. No discrete or worrisome osseous lesions. Paraspinal and other soft tissues: Paraspinous soft tissues demonstrate no acute finding. Mild scattered dependent atelectatic changes present within the visualized lungs. Aortic atherosclerosis. Disc levels: Mild for age multilevel disc desiccation noted within the lower thoracic spine. Mild lower cervical spondylosis. No significant spinal stenosis. Foramina appear patent. IMPRESSION: 1. No acute traumatic injury within the thoracic spine. 2. Aortic Atherosclerosis (ICD10-I70.0). Electronically Signed   By: Jeannine Boga M.D.   On: 12/14/2021 01:52   CT Lumbar Spine Wo Contrast  Result Date: 12/14/2021 CLINICAL DATA:  Initial evaluation for acute trauma, fall. EXAM: CT LUMBAR SPINE WITHOUT CONTRAST TECHNIQUE: Multidetector CT imaging of the lumbar spine was performed  without intravenous contrast administration. Multiplanar CT image reconstructions were also generated. COMPARISON:  Prior CT from 10/01/2021. FINDINGS: Segmentation: Standard. Lowest well-formed disc space labeled the L5-S1 level. Alignment: Levoscoliosis with apex at L3. Trace retrolisthesis of L3 on L4, chronic and facet mediated, and stable from prior. Vertebrae: Vertebral body height maintained without acute or chronic fracture. There is an acute minimally displaced fracture extending through the left sacral ala (series 4, image 126). Involvement of both the S1 and S2 segments. SI joints remain approximated and symmetric. Remainder of the visualized bony pelvis otherwise intact. No other acute fracture. Visualized lower ribs intact. No discrete or worrisome osseous lesions. Paraspinal and other soft tissues: Mild edema adjacent to the left sacral fracture. Paraspinous soft tissues demonstrate no other acute finding. Moderate aortic atherosclerosis. 3.3 cm left ovarian cyst partially visualized (series 5, image 141). Disc levels: L1-2:  Negative interspace.  Mild facet hypertrophy.  No stenosis. L2-3: Degenerative intervertebral disc space narrowing with mild disc bulge, asymmetric to the right. Moderate bilateral facet hypertrophy. Resultant severe spinal stenosis. Moderate right L2 foraminal narrowing. Left neural foramen remains patent. Appearance is stable. L3-4: Moderate to advanced degenerative intervertebral disc space narrowing with diffuse disc bulge and disc desiccation. Disc bulging asymmetric to the right. Moderate right worse than left facet hypertrophy. Resultant moderate canal with right worse than left lateral recess stenosis. Moderate right with mild left L3 foraminal narrowing. Appearance is stable. L4-5: Moderate degenerative intervertebral disc space narrowing with diffuse disc bulge and disc desiccation. Moderate facet and ligament flavum hypertrophy. Resultant moderate canal with left worse  than right lateral recess stenosis. Moderate left L4 foraminal narrowing. Right neural foramina remains patent. Appearance is stable. L5-S1: Degenerative intervertebral disc space narrowing with diffuse disc bulge. Associated reactive endplate spurring, greater on the left. Severe left with moderate right facet arthrosis. Resultant mild to moderate narrowing of the left lateral recess. Mild right with moderate left L5 foraminal stenosis. Appearance is stable. IMPRESSION: 1. Acute mildly displaced fracture involving the left sacral ala. 2. No other acute traumatic injury within the lumbar spine. 3. 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 Associated moderate right L2 and L3 foraminal stenosis, with moderate left L4 and L5 foraminal narrowing. Appearance is stable from prior. 4. 3.3 cm left ovarian cyst, indeterminate. Recommend follow-up US in 6-12 months. Note: This recommendation does not apply to premenarchal patients and to those with increased risk (genetic, family history, elevated tumor markers or other high-risk factors) of ovarian cancer. Reference: [place] 2020 Feb;  17(2):248-254 5. Aortic Atherosclerosis (ICD10-I70.0). Electronically Signed   By: Jeannine Boga M.D.   On: 12/14/2021 02:16   DG Chest Port 1 View  Result Date: 12/13/2021 CLINICAL DATA:  Recent fall with chest pain, initial encounter EXAM: PORTABLE CHEST 1 VIEW COMPARISON:  07/03/2020 FINDINGS: Cardiac shadow is accentuated by the frontal technique. Tortuous thoracic aorta is seen. Mild vascular congestion is noted without interstitial edema. No acute bony abnormality is seen. IMPRESSION: Vascular congestion without edema. No focal confluent infiltrate is seen. Electronically Signed   By: Inez Catalina M.D.   On: 12/13/2021 23:59   DG Hand Complete Left  Result Date: 12/13/2021 CLINICAL DATA:  Recent  fall with left hand pain, initial encounter EXAM: LEFT HAND - COMPLETE 3+ VIEW COMPARISON:  None. FINDINGS: Degenerative changes are noted the first [facility] joint. No acute fracture or dislocation is noted. No soft tissue changes are seen. IMPRESSION: Degenerative change without acute abnormality. Electronically Signed   By: Inez Catalina M.D.   On: 12/13/2021 23:58   DG HIP UNILAT W OR W/O PELVIS 2-3 VIEWS LEFT  Result Date: 12/13/2021 CLINICAL DATA:  Left hip pain following fall, initial encounter EXAM: DG HIP (WITH OR WITHOUT PELVIS) 3V LEFT COMPARISON:  None. FINDINGS: Pelvic ring is intact. There is a left femoral neck fracture identified with impaction and angulation at the fracture site. Additionally fractures through the superior and inferior pubic rami on the left are seen. Degenerative changes of lumbar spine are noted. No other focal abnormality is seen. IMPRESSION: Left femoral neck fracture with associated superior and inferior pubic rami fractures. Electronically Signed   By: Inez Catalina M.D.   On: 12/13/2021 23:56    ____________________________________________   PROCEDURES  Procedure(s) performed (including Critical Care):  Procedures    ____________________________________________   INITIAL IMPRESSION / ASSESSMENT AND PLAN / ED COURSE  As part of my medical decision making, I reviewed the following data within the [place] History obtained from family, Nursing notes reviewed and incorporated, Labs reviewed , EKG interpreted , Old EKG reviewed, Old chart reviewed, Radiograph reviewed , Discussed with admitting physician , A consult was requested and obtained from this/these consultant(s) Orthopedics, CTs reviewed, and Notes from prior ED visits         Patient here from home after a fall.  Suspect that she has a left-sided hip fracture.  Will obtain imaging of her hip but also of her left hand, elbow, spine and head.  Will provide further pain medication as  she seems very uncomfortable with any manipulation.  Patient will need admission.  We will obtain screening labs, urine, EKG.  Waiting for daughter's arrival to obtain further history.  ED PROGRESS  Daughter now at bedside.  She reports patient lives at home with her husband who cares for her.  She states she is very active and does not use a cane or walker.  She takes aspirin daily but is not on any other antiplatelet or anticoagulant.  Her x-ray shows that she has a left femoral neck fracture as well as inferior and superior pubic rami fractures.  Discussed with daughter that she will need surgical intervention for her femoral neck fracture.  Daughter agrees with this plan.  Discussed with Dr. [name] with orthopedic surgery who agrees as well.  He will see patient in the morning.  Will discuss with hospitalist for admission once we have labs back.  Labs show no significant abnormality.  CT scans show a left sacral  alla fracture but no other acute abnormality.  Other x-rays also show no fracture but she does have a small effusion of the left elbow.  Will discuss with hospitalist for admission.   2:47 AM Discussed patient's case with hospitalist, Dr. Damita Dunnings.  I have recommended admission and patient (and family if present) agree with this plan. Admitting physician will place admission orders.   I reviewed all nursing notes, vitals, pertinent previous records and reviewed/interpreted all EKGs, lab and urine results, imaging (as available).  ____________________________________________   FINAL CLINICAL IMPRESSION(S) / ED DIAGNOSES  Final diagnoses:  Fall, initial encounter  Closed fracture of neck of left femur, initial encounter ([place])  Closed fracture of superior ramus of left pubis, initial encounter ([place])  Inferior pubic ramus fracture, left, closed, initial encounter [facility])     ED Discharge Orders     None       *Please note:  Elysa Womac was evaluated in Emergency Department  on 12/14/2021 for the symptoms described in the history of present illness. She was evaluated in the context of the global COVID-19 pandemic, which necessitated consideration that the patient might be at risk for infection with the SARS-CoV-2 virus that causes COVID-19. Institutional protocols and algorithms that pertain to the evaluation of patients at risk for COVID-19 are in a state of rapid change based on information released by regulatory bodies including the CDC and federal and state organizations. These policies and algorithms were followed during the patient's care in the ED.  Some ED evaluations and interventions may be delayed as a result of limited staffing during and the pandemic.*   Note:  This document was prepared using Dragon voice recognition software and may include unintentional dictation errors.    Diaz Crago, Delice Bison, DO 12/14/21 [phone]

## 2021-12-13 NOTE — ED Triage Notes (Signed)
Pt was at home when she fell and is now complaining of left hip pain. Brought by EMS. Pt has Hx of alzheimer's. Left leg is shortened and rotated.

## 2021-12-14 ENCOUNTER — Encounter
Admission: EM | Disposition: A | Discharge status: Skilled Nursing Facility | Payer: Self-pay | Source: Home / Self Care | Attending: Internal Medicine

## 2021-12-14 ENCOUNTER — Inpatient Hospital Stay: Payer: Medicare Other | Admitting: Anesthesiology

## 2021-12-14 ENCOUNTER — Inpatient Hospital Stay: Payer: Medicare Other

## 2021-12-14 ENCOUNTER — Encounter: Payer: Self-pay | Admitting: Internal Medicine

## 2021-12-14 DIAGNOSIS — W19XXXA Unspecified fall, initial encounter: Secondary | ICD-10-CM | POA: Diagnosis present

## 2021-12-14 DIAGNOSIS — Y92009 Unspecified place in unspecified non-institutional (private) residence as the place of occurrence of the external cause: Secondary | ICD-10-CM

## 2021-12-14 DIAGNOSIS — Z90710 Acquired absence of both cervix and uterus: Secondary | ICD-10-CM | POA: Diagnosis not present

## 2021-12-14 DIAGNOSIS — Z20822 Contact with and (suspected) exposure to covid-19: Secondary | ICD-10-CM | POA: Diagnosis present

## 2021-12-14 DIAGNOSIS — F05 Delirium due to known physiological condition: Secondary | ICD-10-CM | POA: Diagnosis not present

## 2021-12-14 DIAGNOSIS — Z7982 Long term (current) use of aspirin: Secondary | ICD-10-CM | POA: Diagnosis not present

## 2021-12-14 DIAGNOSIS — R296 Repeated falls: Secondary | ICD-10-CM | POA: Diagnosis present

## 2021-12-14 DIAGNOSIS — F01518 Vascular dementia, unspecified severity, with other behavioral disturbance: Secondary | ICD-10-CM | POA: Diagnosis present

## 2021-12-14 DIAGNOSIS — S32592A Other specified fracture of left pubis, initial encounter for closed fracture: Secondary | ICD-10-CM

## 2021-12-14 DIAGNOSIS — E785 Hyperlipidemia, unspecified: Secondary | ICD-10-CM | POA: Diagnosis present

## 2021-12-14 DIAGNOSIS — D62 Acute posthemorrhagic anemia: Secondary | ICD-10-CM | POA: Diagnosis not present

## 2021-12-14 DIAGNOSIS — D472 Monoclonal gammopathy: Secondary | ICD-10-CM | POA: Diagnosis present

## 2021-12-14 DIAGNOSIS — G9341 Metabolic encephalopathy: Secondary | ICD-10-CM | POA: Diagnosis not present

## 2021-12-14 DIAGNOSIS — E119 Type 2 diabetes mellitus without complications: Secondary | ICD-10-CM | POA: Diagnosis present

## 2021-12-14 DIAGNOSIS — Z79890 Hormone replacement therapy (postmenopausal): Secondary | ICD-10-CM | POA: Diagnosis not present

## 2021-12-14 DIAGNOSIS — E039 Hypothyroidism, unspecified: Secondary | ICD-10-CM | POA: Diagnosis present

## 2021-12-14 DIAGNOSIS — M25552 Pain in left hip: Secondary | ICD-10-CM | POA: Diagnosis present

## 2021-12-14 DIAGNOSIS — M1612 Unilateral primary osteoarthritis, left hip: Secondary | ICD-10-CM | POA: Diagnosis present

## 2021-12-14 DIAGNOSIS — S72002A Fracture of unspecified part of neck of left femur, initial encounter for closed fracture: Secondary | ICD-10-CM | POA: Diagnosis present

## 2021-12-14 DIAGNOSIS — K219 Gastro-esophageal reflux disease without esophagitis: Secondary | ICD-10-CM | POA: Diagnosis present

## 2021-12-14 DIAGNOSIS — T8182XA Emphysema (subcutaneous) resulting from a procedure, initial encounter: Secondary | ICD-10-CM | POA: Diagnosis present

## 2021-12-14 DIAGNOSIS — F0282 Dementia in other diseases classified elsewhere, unspecified severity, with psychotic disturbance: Secondary | ICD-10-CM | POA: Diagnosis not present

## 2021-12-14 DIAGNOSIS — F0390 Unspecified dementia without behavioral disturbance: Secondary | ICD-10-CM | POA: Diagnosis present

## 2021-12-14 DIAGNOSIS — F0152 Vascular dementia, unspecified severity, with psychotic disturbance: Secondary | ICD-10-CM | POA: Diagnosis not present

## 2021-12-14 DIAGNOSIS — S60022A Contusion of left index finger without damage to nail, initial encounter: Secondary | ICD-10-CM | POA: Diagnosis present

## 2021-12-14 DIAGNOSIS — I10 Essential (primary) hypertension: Secondary | ICD-10-CM | POA: Diagnosis present

## 2021-12-14 DIAGNOSIS — F02818 Dementia in other diseases classified elsewhere, unspecified severity, with other behavioral disturbance: Secondary | ICD-10-CM | POA: Diagnosis not present

## 2021-12-14 DIAGNOSIS — G309 Alzheimer's disease, unspecified: Secondary | ICD-10-CM | POA: Diagnosis present

## 2021-12-14 HISTORY — PX: TOTAL HIP ARTHROPLASTY: SHX124

## 2021-12-14 LAB — URINALYSIS, COMPLETE (UACMP) WITH MICROSCOPIC
Bacteria, UA: NONE SEEN
Bilirubin Urine: NEGATIVE
Glucose, UA: NEGATIVE mg/dL
Ketones, ur: NEGATIVE mg/dL
Leukocytes,Ua: NEGATIVE
Nitrite: NEGATIVE
Protein, ur: NEGATIVE mg/dL
Specific Gravity, Urine: 1.025 (ref 1.005–1.030)
pH: 6.5 (ref 5.0–8.0)

## 2021-12-14 LAB — BASIC METABOLIC PANEL
Anion gap: 7 (ref 5–15)
BUN: 13 mg/dL (ref 8–23)
CO2: 25 mmol/L (ref 22–32)
Calcium: 8.7 mg/dL — ABNORMAL LOW (ref 8.9–10.3)
Chloride: 105 mmol/L (ref 98–111)
Creatinine, Ser: 0.56 mg/dL (ref 0.44–1.00)
GFR, Estimated: >60 mL/min (ref >60)
Glucose, Bld: 150 mg/dL — ABNORMAL HIGH (ref 70–99)
Potassium: 3.2 mmol/L — ABNORMAL LOW (ref 3.5–5.1)
Sodium: 137 mmol/L (ref 135–145)

## 2021-12-14 LAB — CBC WITH DIFFERENTIAL/PLATELET
Abs Immature Granulocytes: 0.08 10*3/uL — ABNORMAL HIGH (ref 0.00–0.07)
Basophils Absolute: 0 10*3/uL (ref 0.0–0.1)
Basophils Relative: 0 %
Eosinophils Absolute: 0.1 10*3/uL (ref 0.0–0.5)
Eosinophils Relative: 1 %
HCT: 38.4 % (ref 36.0–46.0)
Hemoglobin: 13 g/dL (ref 12.0–15.0)
Immature Granulocytes: 1 %
Lymphocytes Relative: 17 %
Lymphs Abs: 2 10*3/uL (ref 0.7–4.0)
MCH: 29.5 pg (ref 26.0–34.0)
MCHC: 33.9 g/dL (ref 30.0–36.0)
MCV: 87.1 fL (ref 80.0–100.0)
Monocytes Absolute: 0.8 10*3/uL (ref 0.1–1.0)
Monocytes Relative: 7 %
Neutro Abs: 8.7 10*3/uL — ABNORMAL HIGH (ref 1.7–7.7)
Neutrophils Relative %: 74 %
Platelets: 163 10*3/uL (ref 150–400)
RBC: 4.41 MIL/uL (ref 3.87–5.11)
RDW: 12.9 % (ref 11.5–15.5)
WBC: 11.7 10*3/uL — ABNORMAL HIGH (ref 4.0–10.5)
nRBC: 0 % (ref 0.0–0.2)

## 2021-12-14 LAB — CBC
HCT: 30 % — ABNORMAL LOW (ref 36.0–46.0)
Hemoglobin: 10.2 g/dL — ABNORMAL LOW (ref 12.0–15.0)
MCH: 30 pg (ref 26.0–34.0)
MCHC: 34 g/dL (ref 30.0–36.0)
MCV: 88.2 fL (ref 80.0–100.0)
Platelets: 146 10*3/uL — ABNORMAL LOW (ref 150–400)
RBC: 3.4 MIL/uL — ABNORMAL LOW (ref 3.87–5.11)
RDW: 13.1 % (ref 11.5–15.5)
WBC: 18.7 10*3/uL — ABNORMAL HIGH (ref 4.0–10.5)
nRBC: 0 % (ref 0.0–0.2)

## 2021-12-14 LAB — RESP PANEL BY RT-PCR (FLU A&B, COVID) ARPGX2
Influenza A by PCR: NEGATIVE
Influenza B by PCR: NEGATIVE
SARS Coronavirus 2 by RT PCR: NEGATIVE

## 2021-12-14 LAB — TROPONIN I (HIGH SENSITIVITY)
Troponin I (High Sensitivity): 6 ng/L (ref <18)
Troponin I (High Sensitivity): 5 ng/L (ref <18)

## 2021-12-14 LAB — CREATININE, SERUM
Creatinine, Ser: 0.5 mg/dL (ref 0.44–1.00)
GFR, Estimated: >60 mL/min (ref >60)

## 2021-12-14 SURGERY — ARTHROPLASTY, HIP, TOTAL, ANTERIOR APPROACH
Anesthesia: Spinal | Site: Hip | Laterality: Left

## 2021-12-14 MED ORDER — METHOCARBAMOL 1000 MG/10ML IJ SOLN
500.0000 mg | Freq: Four times a day (QID) | INTRAVENOUS | Status: DC | PRN
  Filled 2021-12-14: qty 5

## 2021-12-14 MED ORDER — ONDANSETRON HCL 4 MG/2ML IJ SOLN
4.0000 mg | Freq: Once | INTRAMUSCULAR | Status: AC
  Administered 2021-12-14: 01:00:00 4 mg via INTRAVENOUS
  Filled 2021-12-14: qty 2

## 2021-12-14 MED ORDER — PANTOPRAZOLE SODIUM 40 MG PO TBEC
40.0000 mg | DELAYED_RELEASE_TABLET | Freq: Every day | ORAL | Status: DC
  Administered 2021-12-15 – 2021-12-17 (×3): 40 mg via ORAL
  Filled 2021-12-14 (×4): qty 1

## 2021-12-14 MED ORDER — PROPOFOL 500 MG/50ML IV EMUL
INTRAVENOUS | Status: DC | PRN
  Administered 2021-12-14: 40 ug/kg/min via INTRAVENOUS

## 2021-12-14 MED ORDER — PHENOL 1.4 % MT LIQD
1.0000 | OROMUCOSAL | Status: DC | PRN
  Filled 2021-12-14: qty 177

## 2021-12-14 MED ORDER — PHENYLEPHRINE HCL (PRESSORS) 10 MG/ML IV SOLN
INTRAVENOUS | Status: DC | PRN
  Administered 2021-12-14 (×3): 100 ug via INTRAVENOUS

## 2021-12-14 MED ORDER — FLEET ENEMA 7-19 GM/118ML RE ENEM: 1.0000 | ENEMA | Freq: Once | RECTAL | Status: DC | PRN

## 2021-12-14 MED ORDER — ASPIRIN 81 MG PO CHEW
81.0000 mg | CHEWABLE_TABLET | Freq: Every day | ORAL | Status: DC
  Administered 2021-12-15 – 2021-12-17 (×3): 81 mg via ORAL
  Filled 2021-12-14 (×3): qty 1

## 2021-12-14 MED ORDER — ALUM & MAG HYDROXIDE-SIMETH 200-200-20 MG/5ML PO SUSP: 30.0000 mL | ORAL | Status: DC | PRN

## 2021-12-14 MED ORDER — ONDANSETRON HCL 4 MG PO TABS
4.0000 mg | ORAL_TABLET | Freq: Four times a day (QID) | ORAL | Status: DC | PRN
  Filled 2021-12-14: qty 1

## 2021-12-14 MED ORDER — BUPIVACAINE-EPINEPHRINE (PF) 0.25% -1:200000 IJ SOLN
INTRAMUSCULAR | Status: AC
  Filled 2021-12-14: qty 30

## 2021-12-14 MED ORDER — SODIUM CHLORIDE 0.9 % IV SOLN: 250.0000 mL | INTRAVENOUS | Status: DC

## 2021-12-14 MED ORDER — ATORVASTATIN CALCIUM 20 MG PO TABS
40.0000 mg | ORAL_TABLET | Freq: Every day | ORAL | Status: DC
  Administered 2021-12-15 – 2021-12-21 (×6): 40 mg via ORAL
  Filled 2021-12-14 (×7): qty 2

## 2021-12-14 MED ORDER — ONDANSETRON HCL 4 MG/2ML IJ SOLN: 4.0000 mg | Freq: Four times a day (QID) | INTRAMUSCULAR | Status: DC | PRN

## 2021-12-14 MED ORDER — BISACODYL 10 MG RE SUPP
10.0000 mg | Freq: Every day | RECTAL | Status: DC | PRN
  Administered 2021-12-16: 16:00:00 10 mg via RECTAL
  Filled 2021-12-14: qty 1

## 2021-12-14 MED ORDER — METOCLOPRAMIDE HCL 5 MG/ML IJ SOLN: 5.0000 mg | Freq: Three times a day (TID) | INTRAMUSCULAR | Status: DC | PRN

## 2021-12-14 MED ORDER — DIPHENHYDRAMINE HCL 12.5 MG/5ML PO ELIX: 12.5000 mg | ORAL_SOLUTION | ORAL | Status: DC | PRN

## 2021-12-14 MED ORDER — LACTATED RINGERS IV SOLN: INTRAVENOUS | Status: DC | PRN

## 2021-12-14 MED ORDER — PHENYLEPHRINE HCL-NACL 20-0.9 MG/250ML-% IV SOLN
INTRAVENOUS | Status: DC | PRN
  Administered 2021-12-14: 40 ug/min via INTRAVENOUS

## 2021-12-14 MED ORDER — PROPOFOL 10 MG/ML IV BOLUS
INTRAVENOUS | Status: AC
  Filled 2021-12-14: qty 20

## 2021-12-14 MED ORDER — SODIUM CHLORIDE FLUSH 0.9 % IV SOLN
INTRAVENOUS | Status: AC
  Filled 2021-12-14: qty 10

## 2021-12-14 MED ORDER — PRONTOSAN WOUND IRRIGATION OPTIME
TOPICAL | Status: DC | PRN
  Administered 2021-12-14: 1 via TOPICAL

## 2021-12-14 MED ORDER — EPHEDRINE SULFATE 50 MG/ML IJ SOLN
INTRAMUSCULAR | Status: DC | PRN
  Administered 2021-12-14: 10 mg via INTRAVENOUS

## 2021-12-14 MED ORDER — MENTHOL 3 MG MT LOZG
1.0000 | LOZENGE | OROMUCOSAL | Status: DC | PRN
  Filled 2021-12-14: qty 9

## 2021-12-14 MED ORDER — MORPHINE SULFATE (PF) 4 MG/ML IV SOLN: 0.5000 mg | INTRAVENOUS | Status: DC | PRN

## 2021-12-14 MED ORDER — CEFAZOLIN SODIUM-DEXTROSE 1-4 GM/50ML-% IV SOLN
1.0000 g | Freq: Once | INTRAVENOUS | Status: AC
  Administered 2021-12-14: 16:00:00 1 g via INTRAVENOUS
  Filled 2021-12-14: qty 50

## 2021-12-14 MED ORDER — SODIUM CHLORIDE 0.9 % IR SOLN
Status: DC | PRN
  Administered 2021-12-14: 15:00:00 350 mL

## 2021-12-14 MED ORDER — PANTOPRAZOLE SODIUM 20 MG PO TBEC
20.0000 mg | DELAYED_RELEASE_TABLET | Freq: Every day | ORAL | Status: DC
  Filled 2021-12-14: qty 1

## 2021-12-14 MED ORDER — HYDROCODONE-ACETAMINOPHEN 5-325 MG PO TABS
1.0000 | ORAL_TABLET | Freq: Four times a day (QID) | ORAL | Status: DC | PRN
  Administered 2021-12-16 – 2021-12-17 (×2): 1 via ORAL
  Filled 2021-12-14: qty 1

## 2021-12-14 MED ORDER — TRAMADOL HCL 50 MG PO TABS
50.0000 mg | ORAL_TABLET | Freq: Four times a day (QID) | ORAL | Status: DC
  Administered 2021-12-15: 06:00:00 50 mg via ORAL
  Filled 2021-12-14: qty 1

## 2021-12-14 MED ORDER — HYDROCODONE-ACETAMINOPHEN 5-325 MG PO TABS
1.0000 | ORAL_TABLET | ORAL | Status: DC | PRN
  Administered 2021-12-15 – 2021-12-16 (×3): 1 via ORAL
  Filled 2021-12-14 (×4): qty 1

## 2021-12-14 MED ORDER — SODIUM CHLORIDE 0.9 % IV SOLN: INTRAVENOUS | Status: DC

## 2021-12-14 MED ORDER — CEFAZOLIN SODIUM-DEXTROSE 1-4 GM/50ML-% IV SOLN
INTRAVENOUS | Status: AC
  Filled 2021-12-14: qty 50

## 2021-12-14 MED ORDER — CEFAZOLIN SODIUM-DEXTROSE 1-4 GM/50ML-% IV SOLN
1.0000 g | Freq: Four times a day (QID) | INTRAVENOUS | Status: AC
  Administered 2021-12-14 – 2021-12-15 (×2): 1 g via INTRAVENOUS
  Filled 2021-12-14 (×2): qty 50

## 2021-12-14 MED ORDER — PHENYLEPHRINE HCL-NACL 20-0.9 MG/250ML-% IV SOLN
25.0000 ug/min | INTRAVENOUS | Status: DC
  Filled 2021-12-14: qty 250

## 2021-12-14 MED ORDER — NEOMYCIN-POLYMYXIN B GU 40-200000 IR SOLN
Status: AC
  Filled 2021-12-14: qty 20

## 2021-12-14 MED ORDER — MORPHINE SULFATE (PF) 2 MG/ML IV SOLN
0.5000 mg | INTRAVENOUS | Status: DC | PRN
  Administered 2021-12-14 – 2021-12-17 (×2): 0.5 mg via INTRAVENOUS
  Filled 2021-12-14 (×2): qty 1

## 2021-12-14 MED ORDER — LEVOTHYROXINE SODIUM 50 MCG PO TABS
50.0000 ug | ORAL_TABLET | Freq: Every day | ORAL | Status: DC
  Administered 2021-12-15 – 2021-12-21 (×7): 50 ug via ORAL
  Filled 2021-12-14 (×8): qty 1

## 2021-12-14 MED ORDER — PHENYLEPHRINE HCL (PRESSORS) 10 MG/ML IV SOLN
INTRAVENOUS | Status: AC
  Filled 2021-12-14: qty 1

## 2021-12-14 MED ORDER — SODIUM CHLORIDE FLUSH 0.9 % IV SOLN
INTRAVENOUS | Status: AC
  Filled 2021-12-14: qty 40

## 2021-12-14 MED ORDER — ACETAMINOPHEN 500 MG PO TABS: 500.0000 mg | ORAL_TABLET | Freq: Four times a day (QID) | ORAL | Status: DC | PRN

## 2021-12-14 MED ORDER — ENOXAPARIN SODIUM 40 MG/0.4ML IJ SOSY
40.0000 mg | PREFILLED_SYRINGE | INTRAMUSCULAR | Status: DC
  Administered 2021-12-15 – 2021-12-16 (×2): 40 mg via SUBCUTANEOUS
  Filled 2021-12-14 (×2): qty 0.4

## 2021-12-14 MED ORDER — BUPIVACAINE HCL (PF) 0.5 % IJ SOLN
INTRAMUSCULAR | Status: DC | PRN
  Administered 2021-12-14: 2.5 mL

## 2021-12-14 MED ORDER — METOCLOPRAMIDE HCL 5 MG PO TABS
5.0000 mg | ORAL_TABLET | Freq: Three times a day (TID) | ORAL | Status: DC | PRN
  Filled 2021-12-14: qty 2

## 2021-12-14 MED ORDER — MONTELUKAST SODIUM 10 MG PO TABS
10.0000 mg | ORAL_TABLET | Freq: Every day | ORAL | Status: DC
  Administered 2021-12-15 – 2021-12-20 (×6): 10 mg via ORAL
  Filled 2021-12-14 (×7): qty 1

## 2021-12-14 MED ORDER — MAGNESIUM HYDROXIDE 400 MG/5ML PO SUSP
30.0000 mL | Freq: Every day | ORAL | Status: AC
  Administered 2021-12-15 – 2021-12-17 (×3): 30 mL via ORAL
  Filled 2021-12-14 (×4): qty 30

## 2021-12-14 MED ORDER — POTASSIUM CHLORIDE 2 MEQ/ML IV SOLN
INTRAVENOUS | Status: AC
  Filled 2021-12-14 (×4): qty 1000

## 2021-12-14 MED ORDER — ACETAMINOPHEN 325 MG PO TABS
325.0000 mg | ORAL_TABLET | Freq: Four times a day (QID) | ORAL | Status: DC | PRN
  Administered 2021-12-20 – 2021-12-21 (×2): 650 mg via ORAL
  Filled 2021-12-14 (×2): qty 2

## 2021-12-14 MED ORDER — METHOCARBAMOL 500 MG PO TABS
500.0000 mg | ORAL_TABLET | Freq: Four times a day (QID) | ORAL | Status: DC | PRN
  Administered 2021-12-16 – 2021-12-17 (×3): 500 mg via ORAL
  Filled 2021-12-14 (×3): qty 1

## 2021-12-14 MED ORDER — ENSURE ENLIVE PO LIQD
237.0000 mL | Freq: Two times a day (BID) | ORAL | Status: DC
  Administered 2021-12-15 – 2021-12-21 (×12): 237 mL via ORAL
  Filled 2021-12-14: qty 237

## 2021-12-14 MED ORDER — ADULT MULTIVITAMIN W/MINERALS CH
1.0000 | ORAL_TABLET | Freq: Every day | ORAL | Status: DC
  Administered 2021-12-15 – 2021-12-21 (×6): 1 via ORAL
  Filled 2021-12-14 (×7): qty 1

## 2021-12-14 MED ORDER — HEMOSTATIC AGENTS (NO CHARGE) OPTIME
TOPICAL | Status: DC | PRN
  Administered 2021-12-14: 2 via TOPICAL

## 2021-12-14 MED ORDER — METOPROLOL SUCCINATE ER 50 MG PO TB24
50.0000 mg | ORAL_TABLET | Freq: Every day | ORAL | Status: DC
  Administered 2021-12-15 – 2021-12-21 (×6): 50 mg via ORAL
  Filled 2021-12-14 (×7): qty 1

## 2021-12-14 MED ORDER — SODIUM CHLORIDE (PF) 0.9 % IJ SOLN
INTRAMUSCULAR | Status: DC | PRN
  Administered 2021-12-14: 17:00:00 90 mL

## 2021-12-14 MED ORDER — MEMANTINE HCL 5 MG PO TABS
10.0000 mg | ORAL_TABLET | Freq: Two times a day (BID) | ORAL | Status: DC
  Administered 2021-12-15 – 2021-12-21 (×12): 10 mg via ORAL
  Filled 2021-12-14 (×14): qty 2

## 2021-12-14 MED ORDER — BUPIVACAINE LIPOSOME 1.3 % IJ SUSP
INTRAMUSCULAR | Status: AC
  Filled 2021-12-14: qty 20

## 2021-12-14 MED ORDER — DOCUSATE SODIUM 100 MG PO CAPS
100.0000 mg | ORAL_CAPSULE | Freq: Two times a day (BID) | ORAL | Status: DC
  Administered 2021-12-15 – 2021-12-17 (×5): 100 mg via ORAL
  Filled 2021-12-14 (×6): qty 1

## 2021-12-14 MED ORDER — PROPOFOL 10 MG/ML IV BOLUS
INTRAVENOUS | Status: DC | PRN
  Administered 2021-12-14 (×3): 40 mg via INTRAVENOUS

## 2021-12-14 MED ORDER — HYDROCODONE-ACETAMINOPHEN 7.5-325 MG PO TABS
1.0000 | ORAL_TABLET | ORAL | Status: DC | PRN
  Filled 2021-12-14: qty 2

## 2021-12-14 SURGICAL SUPPLY — 69 items
BLADE SAGITTAL AGGR TOOTH XLG (BLADE) ×3 IMPLANT
BNDG COHESIVE 6X5 TAN ST LF (GAUZE/BANDAGES/DRESSINGS) ×9 IMPLANT
BOWL CEMENT MIXING ADV NOZZLE (MISCELLANEOUS) ×2 IMPLANT
CANISTER WOUND CARE 500ML ATS (WOUND CARE) ×3 IMPLANT
CEMENT BONE 40GM (Cement) ×4 IMPLANT
CEMENT RESTRICTOR DEPUY SZ 3 (Cement) ×2 IMPLANT
CHLORAPREP W/TINT 26 (MISCELLANEOUS) ×3 IMPLANT
COVER BACK TABLE REUSABLE LG (DRAPES) ×3 IMPLANT
DRAPE 3/4 80X56 (DRAPES) ×9 IMPLANT
DRAPE C-ARM XRAY 36X54 (DRAPES) ×3 IMPLANT
DRAPE INCISE IOBAN 66X60 STRL (DRAPES) IMPLANT
DRAPE POUCH INSTRU U-SHP 10X18 (DRAPES) ×3 IMPLANT
DRESSING SURGICEL FIBRLLR 1X2 (HEMOSTASIS) ×2 IMPLANT
DRSG MEPILEX SACRM 8.7X9.8 (GAUZE/BANDAGES/DRESSINGS) ×3 IMPLANT
DRSG OPSITE POSTOP 4X8 (GAUZE/BANDAGES/DRESSINGS) ×6 IMPLANT
DRSG SURGICEL FIBRILLAR 1X2 (HEMOSTASIS) ×6
ELECT BLADE 6.5 EXT (BLADE) ×3 IMPLANT
ELECT REM PT RETURN 9FT ADLT (ELECTROSURGICAL) ×3
ELECTRODE REM PT RTRN 9FT ADLT (ELECTROSURGICAL) ×1 IMPLANT
GAUZE 4X4 16PLY ~~LOC~~+RFID DBL (SPONGE) ×3 IMPLANT
GLOVE SURG SYN 9.0  PF PI (GLOVE) ×4
GLOVE SURG SYN 9.0 PF PI (GLOVE) ×2 IMPLANT
GLOVE SURG UNDER POLY LF SZ9 (GLOVE) ×3 IMPLANT
GOWN SRG 2XL LVL 4 RGLN SLV (GOWNS) ×1 IMPLANT
GOWN STRL NON-REIN 2XL LVL4 (GOWNS) ×2
GOWN STRL REUS W/ TWL LRG LVL3 (GOWN DISPOSABLE) ×1 IMPLANT
GOWN STRL REUS W/TWL LRG LVL3 (GOWN DISPOSABLE) ×2
HEMOVAC 400CC 10FR (MISCELLANEOUS) IMPLANT
HIP FEM HD M 28 (Head) ×2 IMPLANT
HIP STEM FEM 2 STD (Stem) ×2 IMPLANT
HOLDER FOLEY CATH W/STRAP (MISCELLANEOUS) ×3 IMPLANT
HOLSTER ELECTROSUGICAL PENCIL (MISCELLANEOUS) ×2 IMPLANT
KIT PREVENA INCISION MGT 13 (CANNISTER) ×3 IMPLANT
KNIFE SCULPS 14X20 (INSTRUMENTS) ×2 IMPLANT
LINER DBL MOB SZ 0 52MM (Liner) ×2 IMPLANT
MANIFOLD NEPTUNE II (INSTRUMENTS) ×3 IMPLANT
MAT ABSORB  FLUID 56X50 GRAY (MISCELLANEOUS) ×2
MAT ABSORB FLUID 56X50 GRAY (MISCELLANEOUS) ×1 IMPLANT
NDL SAFETY ECLIPSE 18X1.5 (NEEDLE) ×1 IMPLANT
NDL SPNL 20GX3.5 QUINCKE YW (NEEDLE) ×2 IMPLANT
NEEDLE HYPO 18GX1.5 SHARP (NEEDLE) ×2
NEEDLE SPNL 20GX3.5 QUINCKE YW (NEEDLE) ×6 IMPLANT
NOZZLE FLEX THIN (MISCELLANEOUS) ×2 IMPLANT
NS IRRIG 1000ML POUR BTL (IV SOLUTION) ×3 IMPLANT
PACK HIP COMPR (MISCELLANEOUS) ×3 IMPLANT
PRESSURIZER CEMENT PROX FEM SM (MISCELLANEOUS) ×2 IMPLANT
SCALPEL PROTECTED #10 DISP (BLADE) ×6 IMPLANT
SHELL ACETABULAR SZ 52 DM (Shell) ×2 IMPLANT
SOL PREP PVP 2OZ (MISCELLANEOUS) ×3
SOLUTION PREP PVP 2OZ (MISCELLANEOUS) ×1 IMPLANT
SOLUTION PRONTOSAN WOUND 350ML (IRRIGATION / IRRIGATOR) IMPLANT
SPONGE DRAIN TRACH 4X4 STRL 2S (GAUZE/BANDAGES/DRESSINGS) ×3 IMPLANT
SPONGE T-LAP 18X18 ~~LOC~~+RFID (SPONGE) ×6 IMPLANT
STAPLER SKIN PROX 35W (STAPLE) ×3 IMPLANT
STRAP SAFETY 5IN WIDE (MISCELLANEOUS) ×3 IMPLANT
SUT DVC 2 QUILL PDO  T11 36X36 (SUTURE) ×2
SUT DVC 2 QUILL PDO T11 36X36 (SUTURE) ×1 IMPLANT
SUT SILK 0 (SUTURE) ×2
SUT SILK 0 30XBRD TIE 6 (SUTURE) ×1 IMPLANT
SUT V-LOC 90 ABS DVC 3-0 CL (SUTURE) ×3 IMPLANT
SUT VIC AB 1 CT1 36 (SUTURE) ×3 IMPLANT
SYR 20ML LL LF (SYRINGE) ×3 IMPLANT
SYR 30ML LL (SYRINGE) ×3 IMPLANT
SYR 50ML LL SCALE MARK (SYRINGE) ×6 IMPLANT
SYR BULB IRRIG 60ML STRL (SYRINGE) ×3 IMPLANT
TAPE MICROFOAM 4IN (TAPE) ×3 IMPLANT
TOWEL OR 17X26 4PK STRL BLUE (TOWEL DISPOSABLE) ×3 IMPLANT
TRAY FOLEY MTR SLVR 16FR STAT (SET/KITS/TRAYS/PACK) ×3 IMPLANT
WATER STERILE IRR 500ML POUR (IV SOLUTION) ×3 IMPLANT

## 2021-12-14 NOTE — Progress Notes (Signed)
B/P's have been consistently 160's/60's for the last 20 min after a fluid bolus and ephedrine given by Dr Bertell Maria. Cleared from anesthesia to be transported to the floor.

## 2021-12-14 NOTE — H&P (Signed)
History and Physical    Allison Ramos RDE:[id number] DOB: 1941/09/04 DOA: 12/13/2021  PCP: Latanya Maudlin, NP   Patient coming from: home  I have personally briefly reviewed patient's relevant medical records in [place]  Chief Complaint: fall with left hip pain  HPI: Allison Ramos is a 80 y.o. female with medical history significant for Dementia, MGUS, HLD and osteoarthritis with history of recent falls who presents to the emergency room following a fall.  Patient is unable to contribute to history due to dementia.  ED course: BP 178/75 with otherwise normal vitals Blood work: WBC 11.7, potassium 3.2, troponin 6.  Otherwise unremarkable COVID and flu negative  EKG, personally viewed and interpreted: Sinus rhythm at 62 with no acute ST-T wave changes  Imaging: Significant for left femoral neck fracture with superior and inferior pubic rami fractures Extensive trauma imaging to include CT cervical thoracic/lumbar, CT head as well as x-rays of chest, left elbow, left hand and hip  Patient received fentanyl with EMS and again in the ED.  The ED provider spoke with orthopedist, Dr. [name] will try to take patient to the OR later today.  Hospitalist consulted for admission.    Review of Systems: Unable to obtain due to dementia  Assessment/Plan     Left displaced femoral neck fracture (HCC)   Fracture of multiple pubic rami, left, closed, initial encounter    Fall at home, with history of prior falls mixed - Pain control - N.p.o. 4 OR in the a.m. 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 This is judged to be reasonable and necessary in order to provide the required intensity of service to ensure the patient's safety given the presenting symptoms, physical exam findings, and initial radiographic and laboratory data in the context of their  Comorbid conditions.   Patient requires inpatient status due to high intensity of service, high risk for further deterioration and high frequency of surveillance required.   I certify that at the point of admission it is my clinical judgment that the patient will require inpatient hospital care spanning beyond 2 midnights     Physical Exam: Vitals:   12/13/21 2332 12/14/21 0000  BP: (!) 178/75 (!) 151/84  Pulse: 61 66  Resp: 17 (!) 26  Temp: 98 F (36.7 C)   TempSrc: Oral   SpO2: 94% 100%   Constitutional: Alert, oriented x 1 . Not in any apparent distress HEENT:      Head: Normocephalic and atraumatic.         Eyes: PERLA, EOMI, Conjunctivae are normal. Sclera is non-icteric.       Mouth/Throat: Mucous membranes are moist.       Neck: Supple with no signs of meningismus. Cardiovascular: Regular rate and rhythm. No murmurs, gallops, or rubs. 2+ symmetrical distal pulses are present . No JVD. No  LE edema Respiratory: Respiratory effort normal .Lungs sounds clear bilaterally. No wheezes, crackles, or rhonchi.  Gastrointestinal: Soft, non tender, non distended. Positive bowel sounds.  Genitourinary: No CVA tenderness. Musculoskeletal: Left hip tenderness. No cyanosis, or erythema  of extremities. Neurologic:  Face is symmetric. Moving all extremities. No gross focal neurologic deficits . Skin: Skin is warm, dry.  No rash or ulcers Psychiatric: Mood and affect are appropriate     Past Medical History:  Diagnosis Date   Dementia ([place])    Hyperlipidemia    Osteoarthrosis     Past Surgical History:  Procedure Laterality Date    ABDOMINAL HYSTERECTOMY       reports that she has never smoked. She has never used smokeless tobacco. She reports that she does not currently use alcohol. She reports that she does not use drugs.  Allergies  Allergen Reactions   Darvon [Propoxyphene]    Oxycodone-Acetaminophen Other (See Comments)    Hypersensitivity.     No family history on file.  Family history unable to obtain due to dementia   Prior to Admission medications   Medication Sig Start Date End Date Taking? Authorizing Provider  acetaminophen (TYLENOL) 500 MG tablet Take 500 mg by mouth every 6 (six) hours as needed.   Yes [provider]  ASPIRIN 81 PO Take 81 mg by mouth daily.   Yes [provider]  atorvastatin (LIPITOR) 40 MG tablet Take 40 mg by mouth daily. 02/28/21  Yes [provider]  Boswellia-Glucosamine-Vit D (OSTEO BI-FLEX ONE PER DAY) TABS Take 1 tablet by mouth daily.   Yes [provider]  cholecalciferol (VITAMIN D3) 25 MCG (1000 UNIT) tablet Take 5,000 Units by mouth daily.   Yes [provider]  lidocaine (LIDODERM) 5 % Place 1 patch onto the skin daily. 09/13/21  Yes [provider]  Lutein 6 MG CAPS Take 6 mg by mouth daily.   Yes [provider]  memantine (NAMENDA) 10 MG tablet Take 10 mg by mouth 2 (two) times daily. 02/22/21  Yes [provider]  metoprolol succinate (TOPROL-XL) 50 MG 24 hr tablet Take 50 mg by mouth daily. 11/15/21  Yes [provider]  montelukast (SINGULAIR) 10 MG tablet Take 10 mg by mouth at bedtime. 02/03/21  Yes [provider]  pantoprazole (PROTONIX) 20 MG tablet Take 20 mg by mouth daily. 02/28/21  Yes [provider]  SYNTHROID 50 MCG tablet Take 50 mcg by mouth daily. 02/15/21  Yes [provider]  PREMARIN vaginal cream Place vaginally. Patient not taking: Reported on 12/14/2021 10/20/21   [provider]      Labs on Admission: I have personally  reviewed following labs and imaging studies  CBC: Recent Labs  Lab 12/13/21 2329  WBC 11.7*  NEUTROABS 8.7*  HGB 13.0  HCT 38.4  MCV 87.1  PLT 409   Basic Metabolic Panel: Recent Labs  Lab 12/13/21 2329  NA 137  K 3.2*  CL 105  CO2 25  GLUCOSE 150*  BUN 13  CREATININE 0.56  CALCIUM 8.7*   GFR: CrCl cannot be calculated (Unknown ideal weight.). Liver Function Tests: No results for input(s): AST, ALT, ALKPHOS, BILITOT, PROT, ALBUMIN in the last 168 hours. No results for input(s): LIPASE, AMYLASE in the last 168 hours. No results for input(s): AMMONIA in the last 168 hours. Coagulation Profile: No results for input(s): INR, PROTIME in the last 168 hours. Cardiac Enzymes: No results for input(s): CKTOTAL, CKMB, CKMBINDEX, TROPONINI in the last 168 hours. BNP (last 3 results) No results for input(s): PROBNP in the last 8760 hours. HbA1C: No results for input(s): HGBA1C in the last 72 hours. CBG: No results for input(s): GLUCAP in the last 168 hours.  Lipid Profile: No results for input(s): CHOL, HDL, LDLCALC, TRIG, CHOLHDL, LDLDIRECT in the last 72 hours. Thyroid Function Tests: No results for input(s): TSH, T4TOTAL, FREET4, T3FREE, THYROIDAB in the last 72 hours. Anemia Panel: No results for input(s): VITAMINB12, FOLATE, FERRITIN, TIBC, IRON, RETICCTPCT in the last 72 hours. Urine analysis:    Component Value Date/Time   COLORURINE YELLOW (A) 10/01/2021 1430   APPEARANCEUR HAZY (A) 10/01/2021 1430   LABSPEC 1.006 10/01/2021 1430   PHURINE 9.0 (H) 10/01/2021 1430   GLUCOSEU NEGATIVE 10/01/2021 1430   HGBUR NEGATIVE 10/01/2021 1430   BILIRUBINUR NEGATIVE 10/01/2021 1430   KETONESUR 5 (A) 10/01/2021 1430   PROTEINUR NEGATIVE 10/01/2021 1430   NITRITE POSITIVE (A) 10/01/2021 1430   LEUKOCYTESUR MODERATE (A) 10/01/2021 1430    Radiological Exams on Admission: DG Elbow Complete Left  Result Date: 12/13/2021 CLINICAL DATA:  Left elbow pain following fall,  initial encounter EXAM: LEFT ELBOW - COMPLETE 3+ VIEW COMPARISON:  None. FINDINGS: Elevation of the anterior fat pad is noted. Posterior fat pad is within normal limits. This is consistent with small joint effusion. No acute fracture or dislocation is noted. No other soft tissue abnormality is noted. IMPRESSION: Small joint effusion without definitive fracture. Electronically Signed   By: Inez Catalina M.D.   On: 12/13/2021 23:58   CT HEAD WO CONTRAST (5MM)  Result Date: 12/14/2021 CLINICAL DATA:  Status post fall. EXAM: CT HEAD WITHOUT CONTRAST TECHNIQUE: Contiguous axial images were obtained from the base of the skull through the vertex without intravenous contrast. COMPARISON:  October 01, 2021 FINDINGS: Brain: There is mild cerebral atrophy with widening of the extra-axial spaces and ventricular dilatation. There are areas of decreased attenuation within the white matter tracts of the supratentorial brain, consistent with microvascular disease changes. Vascular: No hyperdense vessel or unexpected calcification. Skull: Normal. Negative for acute fracture. Chronic deformities are seen involving the bilateral mandibular condyles. Sinuses/Orbits: No acute finding. Other: None. IMPRESSION: 1. Mild cerebral atrophy and microvascular disease changes of the supratentorial brain. 2. Chronic deformities are seen involving the bilateral mandibular condyles. 3. No acute intracranial abnormality. Electronically Signed   By: Virgina Norfolk M.D.   On: 12/14/2021 00:51   CT Cervical Spine Wo Contrast  Result Date: 12/14/2021 CLINICAL DATA:  Status post fall. EXAM: CT CERVICAL SPINE WITHOUT CONTRAST TECHNIQUE: Multidetector CT imaging of the cervical spine was performed without intravenous contrast. Multiplanar CT image reconstructions were also generated. COMPARISON:  None. FINDINGS: Alignment: Normal. Skull base and vertebrae: No acute fracture. Chronic deformities are seen along the anterior aspect of the bilateral  mandibular condyles. Soft tissues and spinal canal: No prevertebral fluid or swelling. No visible canal hematoma. Disc levels: Mild endplate sclerosis is seen at the levels of C2-C3 and C3-C4. Moderate to marked severity endplate sclerosis is noted at the level of C6-C7. Fusion of the C4-C5 and C5-C6 intervertebral disc spaces is seen. Moderate severity intervertebral disc space narrowing is seen at the level of C6-C7. Bilateral moderate severity multilevel facet joint hypertrophy is seen, left greater than right. Upper chest: Negative. Other: None. IMPRESSION: 1. No acute fracture or subluxation of the cervical spine. 2. Fusion of the C4-C5 and C5-C6 intervertebral disc spaces. 3. Moderate severity degenerative disc disease at the level of C6-C7. 4. Chronic deformities along the anterior aspect of the bilateral mandibular condyles. Electronically Signed   By: Virgina Norfolk M.D.   On: 12/14/2021 00:54   CT Thoracic Spine Wo Contrast  Result Date: 12/14/2021 CLINICAL DATA:  Initial evaluation for acute trauma, fall. EXAM: CT THORACIC SPINE WITHOUT CONTRAST TECHNIQUE: Multidetector CT images of the thoracic were obtained using the standard protocol without intravenous contrast. COMPARISON:  None available. FINDINGS: Alignment: Physiologic with preservation of the normal thoracic kyphosis. No listhesis. Vertebrae: Vertebral body height maintained without acute or chronic fracture. Visualized ribs intact. No discrete or worrisome osseous lesions. Paraspinal and other soft tissues: Paraspinous soft tissues demonstrate no acute finding. Mild scattered dependent atelectatic changes present within the visualized lungs. Aortic atherosclerosis. Disc levels: Mild for age multilevel disc desiccation noted within the lower thoracic spine. Mild lower cervical spondylosis. No significant spinal stenosis. Foramina appear patent. IMPRESSION: 1. No acute traumatic injury within the thoracic spine. 2. Aortic Atherosclerosis  (ICD10-I70.0). Electronically Signed   By: Jeannine Boga M.D.   On: 12/14/2021 01:52   CT Lumbar Spine Wo Contrast  Result Date: 12/14/2021 CLINICAL DATA:  Initial evaluation for acute trauma, fall. EXAM: CT LUMBAR SPINE WITHOUT CONTRAST TECHNIQUE: Multidetector CT imaging of the lumbar spine was performed without intravenous contrast administration. Multiplanar CT image reconstructions were also generated. COMPARISON:  Prior CT from 10/01/2021. FINDINGS: Segmentation: Standard. Lowest well-formed disc space labeled the L5-S1 level. Alignment: Levoscoliosis with apex at L3. Trace retrolisthesis of L3 on L4, chronic and facet mediated, and stable from prior. Vertebrae: Vertebral body height maintained without acute or chronic fracture. There is an acute minimally displaced fracture extending through the left sacral ala (series 4, image 126). Involvement of both the S1 and S2 segments. SI joints remain approximated and symmetric. Remainder of the visualized bony pelvis otherwise intact. No other acute fracture. Visualized lower ribs intact. No discrete or worrisome osseous lesions. Paraspinal and other soft tissues: Mild edema adjacent to the left sacral fracture. Paraspinous soft tissues demonstrate no other acute finding. Moderate aortic atherosclerosis. 3.3 cm left ovarian cyst partially visualized (series 5, image 141). Disc levels: L1-2:  Negative interspace.  Mild facet hypertrophy.  No stenosis. L2-3: Degenerative intervertebral disc space narrowing with mild disc bulge, asymmetric to the right. Moderate bilateral facet hypertrophy. Resultant severe spinal stenosis. Moderate right L2 foraminal narrowing. Left neural foramen remains patent. Appearance is stable. L3-4: Moderate to advanced degenerative intervertebral disc space narrowing with diffuse disc bulge and disc desiccation. Disc bulging asymmetric to the right. Moderate right worse than left facet hypertrophy. Resultant moderate canal with  right worse than left lateral recess stenosis. Moderate right with mild left L3 foraminal narrowing. Appearance is stable. L4-5: Moderate degenerative intervertebral disc space narrowing with diffuse disc bulge and disc desiccation. Moderate facet and ligament flavum hypertrophy. Resultant moderate canal with left worse than right lateral recess stenosis. Moderate left L4 foraminal narrowing. Right neural foramina remains patent. Appearance is stable. L5-S1: Degenerative intervertebral disc space narrowing with diffuse disc bulge. Associated reactive endplate spurring, greater on the left. Severe left with moderate right facet arthrosis. Resultant mild to moderate narrowing of the left lateral recess. Mild right with moderate left L5 foraminal stenosis. Appearance is stable. IMPRESSION: 1. Acute mildly displaced fracture involving the left sacral ala. 2. No other acute traumatic injury within the lumbar spine. 3. 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 Associated moderate right L2 and L3 foraminal stenosis, with moderate left L4 and L5 foraminal narrowing. Appearance is stable from prior. 4. 3.3 cm left ovarian cyst, indeterminate. Recommend follow-up US in 6-12 months. Note: This recommendation does not apply to premenarchal  patients and to those with increased risk (genetic, family history, elevated tumor markers or other high-risk factors) of ovarian cancer. Reference: JACR 2020 Feb; 17(2):248-254 5. Aortic Atherosclerosis (ICD10-I70.0). Electronically Signed   By: Jeannine Boga M.D.   On: 12/14/2021 02:16   DG Chest Port 1 View  Result Date: 12/13/2021 CLINICAL DATA:  Recent fall with chest pain, initial encounter EXAM: PORTABLE CHEST 1 VIEW COMPARISON:  07/03/2020 FINDINGS: Cardiac shadow is accentuated by the frontal technique. Tortuous thoracic aorta is seen. Mild  vascular congestion is noted without interstitial edema. No acute bony abnormality is seen. IMPRESSION: Vascular congestion without edema. No focal confluent infiltrate is seen. Electronically Signed   By: Inez Catalina M.D.   On: 12/13/2021 23:59   DG Hand Complete Left  Result Date: 12/13/2021 CLINICAL DATA:  Recent fall with left hand pain, initial encounter EXAM: LEFT HAND - COMPLETE 3+ VIEW COMPARISON:  None. FINDINGS: Degenerative changes are noted the first [facility] joint. No acute fracture or dislocation is noted. No soft tissue changes are seen. IMPRESSION: Degenerative change without acute abnormality. Electronically Signed   By: Inez Catalina M.D.   On: 12/13/2021 23:58   DG HIP UNILAT W OR W/O PELVIS 2-3 VIEWS LEFT  Result Date: 12/13/2021 CLINICAL DATA:  Left hip pain following fall, initial encounter EXAM: DG HIP (WITH OR WITHOUT PELVIS) 3V LEFT COMPARISON:  None. FINDINGS: Pelvic ring is intact. There is a left femoral neck fracture identified with impaction and angulation at the fracture site. Additionally fractures through the superior and inferior pubic rami on the left are seen. Degenerative changes of lumbar spine are noted. No other focal abnormality is seen. IMPRESSION: Left femoral neck fracture with associated superior and inferior pubic rami fractures. Electronically Signed   By: Inez Catalina M.D.   On: 12/13/2021 23:56       Athena Masse MD [facility]   12/14/2021, 2:27 AM

## 2021-12-14 NOTE — ED Notes (Signed)
Pt vomited at this time. Pt cleaned up and given a new gown.

## 2021-12-14 NOTE — Consult Note (Addendum)
Reason for Consult: Left hip fracture Referring Physician: Dr. Valente David Ramos is an 80 y.o. female.  HPI: Patient is a 80 year old who suffered a fall last night.  I talked to ER physician last night and her husband was here at that time he reports that she normally is very ambulatory without assistive device.  She is confused and is unable to give history today.  Past Medical History:  Diagnosis Date   Dementia ([place])    Hyperlipidemia    Osteoarthrosis     Past Surgical History:  Procedure Laterality Date   ABDOMINAL HYSTERECTOMY      No family history on file.  Social History:  reports that she has never smoked. She has never used smokeless tobacco. She reports that she does not currently use alcohol. She reports that she does not use drugs.  Allergies:  Allergies  Allergen Reactions   Darvon [Propoxyphene]    Oxycodone-Acetaminophen Other (See Comments)    Hypersensitivity.     Medications: I have reviewed the patient's current medications.  Results for orders placed or performed during the hospital encounter of 12/13/21 (from the past 48 hour(s))  CBC with Differential/Platelet     Status: Abnormal   Collection Time: 12/13/21 11:29 PM  Result Value Ref Range   WBC 11.7 (H) 4.0 - 10.5 K/uL   RBC 4.41 3.87 - 5.11 MIL/uL   Hemoglobin 13.0 12.0 - 15.0 g/dL   HCT 38.4 36.0 - 46.0 %   MCV 87.1 80.0 - 100.0 fL   MCH 29.5 26.0 - 34.0 pg   MCHC 33.9 30.0 - 36.0 g/dL   RDW 12.9 11.5 - 15.5 %   Platelets 163 150 - 400 K/uL   nRBC 0.0 0.0 - 0.2 %   Neutrophils Relative % 74 %   Neutro Abs 8.7 (H) 1.7 - 7.7 K/uL   Lymphocytes Relative 17 %   Lymphs Abs 2.0 0.7 - 4.0 K/uL   Monocytes Relative 7 %   Monocytes Absolute 0.8 0.1 - 1.0 K/uL   Eosinophils Relative 1 %   Eosinophils Absolute 0.1 0.0 - 0.5 K/uL   Basophils Relative 0 %   Basophils Absolute 0.0 0.0 - 0.1 K/uL   Immature Granulocytes 1 %   Abs Immature Granulocytes 0.08 (H) 0.00 - 0.07 K/uL     Comment: Performed at [facility], [address], [place], [place] [address]  Basic metabolic panel     Status: Abnormal   Collection Time: 12/13/21 11:29 PM  Result Value Ref Range   Sodium 137 135 - 145 mmol/L   Potassium 3.2 (L) 3.5 - 5.1 mmol/L   Chloride 105 98 - 111 mmol/L   CO2 25 22 - 32 mmol/L   Glucose, Bld 150 (H) 70 - 99 mg/dL    Comment: Glucose reference range applies only to samples taken after fasting for at least 8 hours.   BUN 13 8 - 23 mg/dL   Creatinine, Ser 0.56 0.44 - 1.00 mg/dL   Calcium 8.7 (L) 8.9 - 10.3 mg/dL   GFR, Estimated >60 >60 mL/min    Comment: (NOTE) Calculated using the CKD-EPI Creatinine Equation (2021)    Anion gap 7 5 - 15    Comment: Performed at [facility], [address] [place],  [address]  Troponin I (High Sensitivity)     Status: None   Collection Time: 12/13/21 11:29 PM  Result Value Ref Range   Troponin I (High Sensitivity) 6 <18 ng/L  Comment: (NOTE) Elevated high sensitivity troponin I (hsTnI) values and significant  changes across serial measurements may suggest ACS but many other  chronic and acute conditions are known to elevate hsTnI results.  Refer to the "Links" section for chest pain algorithms and additional  guidance. Performed at [facility], [address] [place], [place] [address]   Resp Panel by RT-PCR (Flu A&B, Covid) Nasopharyngeal Swab     Status: None   Collection Time: 12/14/21 12:59 AM   Specimen: Nasopharyngeal Swab; Nasopharyngeal(NP) swabs in vial transport medium  Result Value Ref Range   SARS Coronavirus 2 by RT PCR NEGATIVE NEGATIVE    Comment: (NOTE) SARS-CoV-2 target nucleic acids are NOT DETECTED.  The SARS-CoV-2 RNA is generally detectable in upper respiratory specimens during the acute phase of infection. The lowest concentration of SARS-CoV-2 viral copies this assay can detect is 138 copies/mL. A negative result does not preclude  SARS-Cov-2 infection and should not be used as the sole basis for treatment or other patient management decisions. A negative result may occur with  improper specimen collection/handling, submission of specimen other than nasopharyngeal swab, presence of viral mutation(s) within the areas targeted by this assay, and inadequate number of viral copies(<138 copies/mL). A negative result must be combined with clinical observations, patient history, and epidemiological information. The expected result is Negative.  Fact Sheet for Patients:  EntrepreneurPulse.com.au  Fact Sheet for Healthcare Providers:  IncredibleEmployment.be  This test is no t yet approved or cleared by the [place] FDA and  has been authorized for detection and/or diagnosis of SARS-CoV-2 by FDA under an Emergency Use Authorization (EUA). This EUA will remain  in effect (meaning this test can be used) for the duration of the COVID-19 declaration under Section 564(b)(1) of the Act, 21 U.S.C.section 360bbb-3(b)(1), unless the authorization is terminated  or revoked sooner.       Influenza A by PCR NEGATIVE NEGATIVE   Influenza B by PCR NEGATIVE NEGATIVE    Comment: (NOTE) The Xpert Xpress SARS-CoV-2/FLU/RSV plus assay is intended as an aid in the diagnosis of influenza from Nasopharyngeal swab specimens and should not be used as a sole basis for treatment. Nasal washings and aspirates are unacceptable for Xpert Xpress SARS-CoV-2/FLU/RSV testing.  Fact Sheet for Patients: EntrepreneurPulse.com.au  Fact Sheet for Healthcare Providers: IncredibleEmployment.be  This test is not yet approved or cleared by the [place] FDA and has been authorized for detection and/or diagnosis of SARS-CoV-2 by FDA under an Emergency Use Authorization (EUA). This EUA will remain in effect (meaning this test can be used) for the duration of the COVID-19  declaration under Section 564(b)(1) of the Act, 21 U.S.C. section 360bbb-3(b)(1), unless the authorization is terminated or revoked.  Performed at [facility], [place], [place] [address]   Troponin I (High Sensitivity)     Status: None   Collection Time: 12/14/21  1:30 AM  Result Value Ref Range   Troponin I (High Sensitivity) 5 <18 ng/L    Comment: (NOTE) Elevated high sensitivity troponin I (hsTnI) values and significant  changes across serial measurements may suggest ACS but many other  chronic and acute conditions are known to elevate hsTnI results.  Refer to the "Links" section for chest pain algorithms and additional  guidance. Performed at [facility], [address] [place], [place] [address]     DG Elbow Complete Left  Result Date: 12/13/2021 CLINICAL DATA:  Left elbow pain following fall, initial encounter EXAM: LEFT  ELBOW - COMPLETE 3+ VIEW COMPARISON:  None. FINDINGS: Elevation of the anterior fat pad is noted. Posterior fat pad is within normal limits. This is consistent with small joint effusion. No acute fracture or dislocation is noted. No other soft tissue abnormality is noted. IMPRESSION: Small joint effusion without definitive fracture. Electronically Signed   By: Inez Catalina M.D.   On: 12/13/2021 23:58   CT HEAD WO CONTRAST (5MM)  Result Date: 12/14/2021 CLINICAL DATA:  Status post fall. EXAM: CT HEAD WITHOUT CONTRAST TECHNIQUE: Contiguous axial images were obtained from the base of the skull through the vertex without intravenous contrast. COMPARISON:  October 01, 2021 FINDINGS: Brain: There is mild cerebral atrophy with widening of the extra-axial spaces and ventricular dilatation. There are areas of decreased attenuation within the white matter tracts of the supratentorial brain, consistent with microvascular disease changes. Vascular: No hyperdense vessel or unexpected calcification. Skull: Normal. Negative for acute  fracture. Chronic deformities are seen involving the bilateral mandibular condyles. Sinuses/Orbits: No acute finding. Other: None. IMPRESSION: 1. Mild cerebral atrophy and microvascular disease changes of the supratentorial brain. 2. Chronic deformities are seen involving the bilateral mandibular condyles. 3. No acute intracranial abnormality. Electronically Signed   By: Virgina Norfolk M.D.   On: 12/14/2021 00:51   CT Cervical Spine Wo Contrast  Result Date: 12/14/2021 CLINICAL DATA:  Status post fall. EXAM: CT CERVICAL SPINE WITHOUT CONTRAST TECHNIQUE: Multidetector CT imaging of the cervical spine was performed without intravenous contrast. Multiplanar CT image reconstructions were also generated. COMPARISON:  None. FINDINGS: Alignment: Normal. Skull base and vertebrae: No acute fracture. Chronic deformities are seen along the anterior aspect of the bilateral mandibular condyles. Soft tissues and spinal canal: No prevertebral fluid or swelling. No visible canal hematoma. Disc levels: Mild endplate sclerosis is seen at the levels of C2-C3 and C3-C4. Moderate to marked severity endplate sclerosis is noted at the level of C6-C7. Fusion of the C4-C5 and C5-C6 intervertebral disc spaces is seen. Moderate severity intervertebral disc space narrowing is seen at the level of C6-C7. Bilateral moderate severity multilevel facet joint hypertrophy is seen, left greater than right. Upper chest: Negative. Other: None. IMPRESSION: 1. No acute fracture or subluxation of the cervical spine. 2. Fusion of the C4-C5 and C5-C6 intervertebral disc spaces. 3. Moderate severity degenerative disc disease at the level of C6-C7. 4. Chronic deformities along the anterior aspect of the bilateral mandibular condyles. Electronically Signed   By: Virgina Norfolk M.D.   On: 12/14/2021 00:54   CT Thoracic Spine Wo Contrast  Result Date: 12/14/2021 CLINICAL DATA:  Initial evaluation for acute trauma, fall. EXAM: CT THORACIC SPINE  WITHOUT CONTRAST TECHNIQUE: Multidetector CT images of the thoracic were obtained using the standard protocol without intravenous contrast. COMPARISON:  None available. FINDINGS: Alignment: Physiologic with preservation of the normal thoracic kyphosis. No listhesis. Vertebrae: Vertebral body height maintained without acute or chronic fracture. Visualized ribs intact. No discrete or worrisome osseous lesions. Paraspinal and other soft tissues: Paraspinous soft tissues demonstrate no acute finding. Mild scattered dependent atelectatic changes present within the visualized lungs. Aortic atherosclerosis. Disc levels: Mild for age multilevel disc desiccation noted within the lower thoracic spine. Mild lower cervical spondylosis. No significant spinal stenosis. Foramina appear patent. IMPRESSION: 1. No acute traumatic injury within the thoracic spine. 2. Aortic Atherosclerosis (ICD10-I70.0). Electronically Signed   By: Jeannine Boga M.D.   On: 12/14/2021 01:52   CT Lumbar Spine Wo Contrast  Result Date: 12/14/2021 CLINICAL DATA:  Initial evaluation for  acute trauma, fall. EXAM: CT LUMBAR SPINE WITHOUT CONTRAST TECHNIQUE: Multidetector CT imaging of the lumbar spine was performed without intravenous contrast administration. Multiplanar CT image reconstructions were also generated. COMPARISON:  Prior CT from 10/01/2021. FINDINGS: Segmentation: Standard. Lowest well-formed disc space labeled the L5-S1 level. Alignment: Levoscoliosis with apex at L3. Trace retrolisthesis of L3 on L4, chronic and facet mediated, and stable from prior. Vertebrae: Vertebral body height maintained without acute or chronic fracture. There is an acute minimally displaced fracture extending through the left sacral ala (series 4, image 126). Involvement of both the S1 and S2 segments. SI joints remain approximated and symmetric. Remainder of the visualized bony pelvis otherwise intact. No other acute fracture. Visualized lower ribs  intact. No discrete or worrisome osseous lesions. Paraspinal and other soft tissues: Mild edema adjacent to the left sacral fracture. Paraspinous soft tissues demonstrate no other acute finding. Moderate aortic atherosclerosis. 3.3 cm left ovarian cyst partially visualized (series 5, image 141). Disc levels: L1-2:  Negative interspace.  Mild facet hypertrophy.  No stenosis. L2-3: Degenerative intervertebral disc space narrowing with mild disc bulge, asymmetric to the right. Moderate bilateral facet hypertrophy. Resultant severe spinal stenosis. Moderate right L2 foraminal narrowing. Left neural foramen remains patent. Appearance is stable. L3-4: Moderate to advanced degenerative intervertebral disc space narrowing with diffuse disc bulge and disc desiccation. Disc bulging asymmetric to the right. Moderate right worse than left facet hypertrophy. Resultant moderate canal with right worse than left lateral recess stenosis. Moderate right with mild left L3 foraminal narrowing. Appearance is stable. L4-5: Moderate degenerative intervertebral disc space narrowing with diffuse disc bulge and disc desiccation. Moderate facet and ligament flavum hypertrophy. Resultant moderate canal with left worse than right lateral recess stenosis. Moderate left L4 foraminal narrowing. Right neural foramina remains patent. Appearance is stable. L5-S1: Degenerative intervertebral disc space narrowing with diffuse disc bulge. Associated reactive endplate spurring, greater on the left. Severe left with moderate right facet arthrosis. Resultant mild to moderate narrowing of the left lateral recess. Mild right with moderate left L5 foraminal stenosis. Appearance is stable. IMPRESSION: 1. Acute mildly displaced fracture involving the left sacral ala. 2. No other acute traumatic injury within the lumbar spine. 3. 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 Associated moderate right L2 and L3 foraminal stenosis, with moderate left L4 and L5 foraminal narrowing. Appearance is stable from prior. 4. 3.3 cm left ovarian cyst, indeterminate. Recommend follow-up US in 6-12 months. Note: This recommendation does not apply to premenarchal patients and to those with increased risk (genetic, family history, elevated tumor markers or other high-risk factors) of ovarian cancer. Reference: JACR 2020 Feb; 17(2):248-254 5. Aortic Atherosclerosis (ICD10-I70.0). Electronically Signed   By: Jeannine Boga M.D.   On: 12/14/2021 02:16   DG Chest Port 1 View  Result Date: 12/13/2021 CLINICAL DATA:  Recent fall with chest pain, initial encounter EXAM: PORTABLE CHEST 1 VIEW COMPARISON:  07/03/2020 FINDINGS: Cardiac shadow is accentuated by the frontal technique. Tortuous thoracic aorta is seen. Mild vascular congestion is noted without interstitial edema. No acute bony abnormality is seen. IMPRESSION: Vascular congestion without edema. No focal confluent infiltrate is seen. Electronically Signed   By: Inez Catalina M.D.   On: 12/13/2021 23:59   DG Hand Complete Left  Result Date: 12/13/2021 CLINICAL DATA:  Recent fall with left hand pain, initial encounter EXAM: LEFT HAND - COMPLETE 3+ VIEW COMPARISON:  None. FINDINGS:  Degenerative changes are noted the first [employer] joint. No acute fracture or dislocation is noted. No soft tissue changes are seen. IMPRESSION: Degenerative change without acute abnormality. Electronically Signed   By: Inez Catalina M.D.   On: 12/13/2021 23:58   DG HIP UNILAT W OR W/O PELVIS 2-3 VIEWS LEFT  Result Date: 12/13/2021 CLINICAL DATA:  Left hip pain following fall, initial encounter EXAM: DG HIP (WITH OR WITHOUT PELVIS) 3V LEFT COMPARISON:  None. FINDINGS: Pelvic ring is intact. There is a left femoral neck fracture identified with impaction and angulation at the fracture site. Additionally fractures through the  superior and inferior pubic rami on the left are seen. Degenerative changes of lumbar spine are noted. No other focal abnormality is seen. IMPRESSION: Left femoral neck fracture with associated superior and inferior pubic rami fractures. Electronically Signed   By: Inez Catalina M.D.   On: 12/13/2021 23:56    Review of Systems Blood pressure 140/65, pulse 87, temperature 98.2 F (36.8 C), temperature source Oral, resp. rate 20, SpO2 100 %. Physical Exam Her left leg is slightly shortened and externally rotated with intact pulses.  She is able flex extend her toes and her skin about the hip is normal. Assessment/Plan: Left femoral neck fracture with moderate hip osteoarthritis with pubic rami fractures on the ipsilateral side. Plan is for hip hemiarthroplasty or total arthroplasty.  I will discuss with her husband and depending on her activity level with her level of osteoarthritis might be better to do a total hip but if she is not that ambulatory hemiarthroplasty would be indicated.  She is likely to have a slow rehab postop secondary to the pubic rami fractures that we will slow her down as well.  Allison Ramos 12/14/2021, 7:28 AM   Discussed her situation with her husband.  He reports that they walk over a mile a day and she does complain of hip pain as well as back pain.  We will based on her radiographic findings of osteoarthritis we will plan on total hip later today.   Called daughter, Allison Ramos, regarding surgery.  She is POA.

## 2021-12-14 NOTE — Transfer of Care (Signed)
Immediate Anesthesia Transfer of Care Note  Patient: Allison Ramos  Procedure(s) Performed: TOTAL HIP ARTHROPLASTY ANTERIOR APPROACH (Left: Hip)  Patient Location: PACU  Anesthesia Type:Spinal  Level of Consciousness: awake and patient cooperative  Airway & Oxygen Therapy: Patient Spontanous Breathing and Patient connected to face mask oxygen  Post-op Assessment: Report given to RN and Post -op Vital signs reviewed and stable  Post vital signs: Reviewed and stable  Last Vitals:  Vitals Value Taken Time  BP 120/50 12/14/21 1715  Temp    Pulse 64 12/14/21 1717  Resp 15 12/14/21 1717  SpO2 99 % 12/14/21 1717  Vitals shown include unvalidated device data.  Last Pain:  Vitals:   12/14/21 1405  TempSrc: Oral  PainSc:          Complications: No notable events documented.

## 2021-12-14 NOTE — Progress Notes (Signed)
Called to PACU for hypotension. Patient with systolics in the 95Z-96D, unclear if symptomatic because of dementia, however she appears alert. Spinal appears to still be in effect - patient not moving lower extremities. Likely 2/2 persistent sympatholysis from spinal.  Ephedrine 10mg  administered, phenylephrine drip ordered until spinal wears off.

## 2021-12-14 NOTE — Progress Notes (Signed)
Allison Ramos is a 80 y.o. female with medical history significant for Dementia, MGUS, HLD and osteoarthritis with history of recent falls who presents to the emergency room following a fall.  Patient is unable to contribute to history due to dementia.   ED course: BP 178/75 with otherwise normal vitals Blood work: WBC 11.7, potassium 3.2, troponin 6.  Otherwise unremarkable COVID and flu negative   12/14/2021: Patient seen and examined at bedside.  She is alert and confused.  States she has been is unable to quantify her pain.  As needed narcotic analgesics in place.  Plan orthopedic surgery for repair of her left femoral neck fracture.  Please refer to H&P dictated by my partner Dr. Damita Dunnings on 12/14/2021 for further details of the assessment and plan.

## 2021-12-14 NOTE — Op Note (Signed)
12/14/2021  5:11 PM  PATIENT:  Allison Ramos  80 y.o. female  PRE-OPERATIVE DIAGNOSIS:  Fracture left hip, femoral neck with osteoarthritis  POST-OPERATIVE DIAGNOSIS:  Fracture left hip, femoral neck with osteoarthritis  PROCEDURE:  Procedure(s): TOTAL HIP ARTHROPLASTY ANTERIOR APPROACH (Left)  SURGEON: Laurene Footman, MD  ASSISTANTS: None  ANESTHESIA:   spinal  EBL:  Total I/O In: 1050 [I.V.:1000; IV Piggyback:50] Out: 750 [Urine:400; Blood:350]  BLOOD ADMINISTERED:none  DRAINS: Incisional wound VAC  LOCAL MEDICATIONS USED:  MARCAINE    and OTHER Exparel  SPECIMEN: Left femoral head  DISPOSITION OF SPECIMEN:  PATHOLOGY  COUNTS:  YES  TOURNIQUET:  * No tourniquets in log *  IMPLANTS: Medacta cemented AMIS 2 standard stem with M 28 mm head and 52 mm Mpact DM cup and liner  DICTATION: .Dragon Dictation   The patient was brought to the operating room and after spinal anesthesia was obtained patient was placed on the operative table with the ipsilateral foot into the Medacta attachment, contralateral leg on a well-padded table. C-arm was brought in and preop template x-ray taken. After prepping and draping in usual sterile fashion appropriate patient identification and timeout procedures were completed. Anterior approach to the hip was obtained and centered over the greater trochanter and TFL muscle. The subcutaneous tissue was incised hemostasis being achieved by electrocautery. TFL fascia was incised and the muscle retracted laterally deep retractor placed. The lateral femoral circumflex vessels were identified and ligated. The anterior capsule was exposed and a capsulotomy performed. The neck was identified and a femoral neck cut carried out with a saw.  This was done below the level of the fracture the head was removed without difficulty and showed sclerotic femoral head and acetabulum. Reaming was carried out to 52 mm and a 52 mm cup trial gave appropriate tightness to  the acetabular component a 52 DM cup was impacted into position. The leg was then externally rotated and ischiofemoral and pubofemoral releases carried out. The femur was sequentially broached to a size 3, size 3 trials were placed and the final components chosen.  The canal was prepared with a 3 cement restrictor from [employer] and the size 2 stem was cemented after pressurizing the cement in the canal.  After the cement had set and excess cement was removed sponges were removed from the acetabulum and a metal M 28 mm head and 52 mm liner were assembled and impacted onto the stem. The hip was reduced and was stable the wound was thoroughly irrigated with fibrillar placed along the posterior capsule and medial neck. The deep fascia ws closed using a heavy Quill after infiltration of 30 cc of quarter percent Sensorcaine with epinephrine diluted with Exparel throughout the case .3-0 V-loc to close the skin with skin staples.  Incisional wound VAC applied and patient was sent to recovery in stable condition.   PLAN OF CARE: Admit to inpatient

## 2021-12-14 NOTE — Anesthesia Procedure Notes (Signed)
Spinal  Patient location during procedure: OR Start time: 12/14/2021 3:20 PM End time: 12/14/2021 3:40 PM Reason for block: surgical anesthesia Staffing Performed: anesthesiologist  Anesthesiologist: Monay Clan, MD Resident/CRNA: Mina Marble D, CRNA Preanesthetic Checklist Completed: patient identified, IV checked, site marked, risks and benefits discussed, surgical consent, monitors and equipment checked, pre-op evaluation and timeout performed Spinal Block Patient position: right lateral decubitus Prep: ChloraPrep Patient monitoring: heart rate, continuous pulse ox, blood pressure and cardiac monitor Approach: midline Location: L3-4 Injection technique: single-shot Needle Needle type: [name] and Introducer  Needle gauge: 24 G Needle length: 9 cm Assessment Sensory level: T10 Events: CSF return Additional Notes Negative paresthesia. Negative blood return. Positive free-flowing CSF. Expiration date of kit checked and confirmed. Patient tolerated procedure well, without complications.

## 2021-12-14 NOTE — Progress Notes (Signed)
Initial Nutrition Assessment  DOCUMENTATION CODES:   Not applicable  INTERVENTION:   -Once diet is advanced, add:   -Ensure Enlive po BID, each supplement provides 350 kcal and 20 grams of protein  -MVI with minerals daily  NUTRITION DIAGNOSIS:   Increased nutrient needs related to post-op healing as evidenced by estimated needs.  GOAL:   Patient will meet greater than or equal to 90% of their needs  MONITOR:   PO intake, Supplement acceptance, Diet advancement, Labs, Weight trends, Skin, I & O's  REASON FOR ASSESSMENT:   Consult Assessment of nutrition requirement/status, Hip fracture protocol  ASSESSMENT:   Allison Ramos is a 80 y.o. female with medical history significant for Dementia, MGUS, HLD and osteoarthritis with history of recent falls who presents to the emergency room following a fall.  Patient is unable to contribute to history due to dementia.  Pt admitted with lt displaced femoral neck fracture and fracture of multiple lt pubic rami s/p fall.   Per orthopedics notes, plan for hemiarthroplasty or total arthoplasty. Pt currently NPO for procedure.   Pt unavailable at time of visit. Pt currently in OR at time of visit. RD unable to obtain further nutrition-related history or complete nutrition-focused physical exam at this time.      Reviewed wt hx; pt has experienced a 5.5% wt loss over the past 8 months, which is not significant for time frame.   Pt with increased nutritional needs for post-operative healing and would benefit from addition of oral nutrition supplements.   Medications reviewed and include lactated ringers with KCl at 75 ml/hr.   Labs reviewed: K: 3.2.    Diet Order:   Diet Order             Diet NPO time specified  Diet effective now                   EDUCATION NEEDS:   Not appropriate for education at this time  Skin:  Skin Assessment: Reviewed RN Assessment  Last BM:  Unknown  Height:   Ht Readings from Last 1  Encounters:  12/14/21 5\' 9"  (1.753 m)    Weight:   Wt Readings from Last 1 Encounters:  12/14/21 72.6 kg    Ideal Body Weight:  65.9 kg  BMI:  Body mass index is 23.63 kg/m.  Estimated Nutritional Needs:   Kcal:  1800-2000  Protein:  90-105 grams  Fluid:  > 1.8 L    Loistine Chance, RD, LDN, [place] Registered Dietitian II Certified Diabetes Care and Education Specialist Please refer to [facility] for RD and/or RD on-call/weekend/after hours pager

## 2021-12-14 NOTE — Anesthesia Preprocedure Evaluation (Signed)
Anesthesia Evaluation  Patient identified by MRN, date of birth, ID band Patient awake    Reviewed: Allergy & Precautions, H&P , NPO status , Patient's Chart, lab work & pertinent test results, reviewed documented beta blocker date and time   History of Anesthesia Complications Negative for: history of anesthetic complications  Airway Mallampati: I  TM Distance: >3 FB Neck ROM: full    Dental  (+) Dental Advidsory Given, Edentulous Upper, Edentulous Lower   Pulmonary neg pulmonary ROS,    Pulmonary exam normal breath sounds clear to auscultation       Cardiovascular Exercise Tolerance: Good negative cardio ROS Normal cardiovascular exam Rhythm:regular Rate:Normal     Neuro/Psych PSYCHIATRIC DISORDERS Dementia negative neurological ROS     GI/Hepatic negative GI ROS, Neg liver ROS,   Endo/Other  negative endocrine ROS  Renal/GU negative Renal ROS  negative genitourinary   Musculoskeletal   Abdominal   Peds  Hematology negative hematology ROS (+)   Anesthesia Other Findings Past Medical History: No date: Dementia (HCC) No date: Hyperlipidemia No date: Osteoarthrosis   Reproductive/Obstetrics negative OB ROS                             Anesthesia Physical Anesthesia Plan  ASA: 2  Anesthesia Plan: Spinal   Post-op Pain Management:    Induction:   PONV Risk Score and Plan: 2 and Propofol infusion and TIVA  Airway Management Planned: Natural Airway and Simple Face Mask  Additional Equipment:   Intra-op Plan:   Post-operative Plan:   Informed Consent: I have reviewed the patients History and Physical, chart, labs and discussed the procedure including the risks, benefits and alternatives for the proposed anesthesia with the patient or authorized representative who has indicated his/her understanding and acceptance.     Dental Advisory Given  Plan Discussed with:  Anesthesiologist, CRNA and Surgeon  Anesthesia Plan Comments:         Anesthesia Quick Evaluation

## 2021-12-15 ENCOUNTER — Encounter: Payer: Self-pay | Admitting: Orthopedic Surgery

## 2021-12-15 ENCOUNTER — Other Ambulatory Visit: Payer: Self-pay

## 2021-12-15 DIAGNOSIS — R296 Repeated falls: Secondary | ICD-10-CM | POA: Diagnosis not present

## 2021-12-15 LAB — BASIC METABOLIC PANEL
Anion gap: 10 (ref 5–15)
BUN: 15 mg/dL (ref 8–23)
CO2: 23 mmol/L (ref 22–32)
Calcium: 8.1 mg/dL — ABNORMAL LOW (ref 8.9–10.3)
Chloride: 106 mmol/L (ref 98–111)
Creatinine, Ser: 0.6 mg/dL (ref 0.44–1.00)
GFR, Estimated: >60 mL/min (ref >60)
Glucose, Bld: 164 mg/dL — ABNORMAL HIGH (ref 70–99)
Potassium: 3.5 mmol/L (ref 3.5–5.1)
Sodium: 139 mmol/L (ref 135–145)

## 2021-12-15 LAB — CBC
HCT: 27.4 % — ABNORMAL LOW (ref 36.0–46.0)
Hemoglobin: 9.1 g/dL — ABNORMAL LOW (ref 12.0–15.0)
MCH: 29.4 pg (ref 26.0–34.0)
MCHC: 33.2 g/dL (ref 30.0–36.0)
MCV: 88.4 fL (ref 80.0–100.0)
Platelets: 131 10*3/uL — ABNORMAL LOW (ref 150–400)
RBC: 3.1 MIL/uL — ABNORMAL LOW (ref 3.87–5.11)
RDW: 13.3 % (ref 11.5–15.5)
WBC: 15 10*3/uL — ABNORMAL HIGH (ref 4.0–10.5)
nRBC: 0 % (ref 0.0–0.2)

## 2021-12-15 LAB — PHOSPHORUS: Phosphorus: 3.6 mg/dL (ref 2.5–4.6)

## 2021-12-15 LAB — MAGNESIUM: Magnesium: 1.9 mg/dL (ref 1.7–2.4)

## 2021-12-15 NOTE — Plan of Care (Signed)
Patient arrived to the unit prior to shift change. Oriented to self only. Patient unable to stay awake long enough to take scheduled meds or to have a conversation. No acute distress noted. Wound vac intact. Bed alarm on. Bed in lowest position.  PLAN OF CARE ONGOING Problem: Education: Goal: Verbalization of understanding the information provided (i.e., activity precautions, restrictions, etc) will improve Outcome: Progressing Goal: Individualized Educational Video(s) Outcome: Progressing   Problem: Activity: Goal: Ability to ambulate and perform ADLs will improve Outcome: Progressing   Problem: Clinical Measurements: Goal: Postoperative complications will be avoided or minimized Outcome: Progressing   Problem: Self-Concept: Goal: Ability to maintain and perform role responsibilities to the fullest extent possible will improve Outcome: Progressing   Problem: Pain Management: Goal: Pain level will decrease Outcome: Progressing   Problem: Education: Goal: Knowledge of General Education information will improve Description: Including pain rating scale, medication(s)/side effects and non-pharmacologic comfort measures Outcome: Progressing   Problem: Health Behavior/Discharge Planning: Goal: Ability to manage health-related needs will improve Outcome: Progressing   Problem: Clinical Measurements: Goal: Ability to maintain clinical measurements within normal limits will improve Outcome: Progressing Goal: Will remain free from infection Outcome: Progressing Goal: Diagnostic test results will improve Outcome: Progressing Goal: Respiratory complications will improve Outcome: Progressing Goal: Cardiovascular complication will be avoided Outcome: Progressing   Problem: Activity: Goal: Risk for activity intolerance will decrease Outcome: Progressing   Problem: Nutrition: Goal: Adequate nutrition will be maintained Outcome: Progressing   Problem: Coping: Goal: Level of  anxiety will decrease Outcome: Progressing   Problem: Elimination: Goal: Will not experience complications related to bowel motility Outcome: Progressing Goal: Will not experience complications related to urinary retention Outcome: Progressing   Problem: Pain Managment: Goal: General experience of comfort will improve Outcome: Progressing   Problem: Safety: Goal: Ability to remain free from injury will improve Outcome: Progressing   Problem: Skin Integrity: Goal: Risk for impaired skin integrity will decrease Outcome: Progressing

## 2021-12-15 NOTE — NC FL2 (Signed)
[place] LEVEL OF CARE SCREENING TOOL     IDENTIFICATION  Patient Name: Allison Ramos Birthdate: 1941-12-17 Sex: female Admission Date (Current Location): 12/13/2021  [facility] and [place] Number:  Engineering geologist and Address:  [facility], [address], [place], [place] [address]      Provider Number: [id number]  Attending Physician Name and Address:  Kayleen Memos, DO  Relative Name and Phone Number:  Virginia Crews [phone]    Current Level of Care: Hospital Recommended Level of Care: [place] Prior Approval Number:    Date Approved/Denied:   PASRR Number: [id number]  Discharge Plan: SNF    Current Diagnoses: Patient Active Problem List   Diagnosis Date Noted   Left displaced femoral neck fracture ([place]) 12/14/2021   Fracture of multiple pubic rami, left, closed, initial encounter ([place]) 12/14/2021   Closed left hip fracture ([place]) 12/14/2021   Dementia ([place])    Fall at home, initial encounter    Recurrent falls    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    Orientation RESPIRATION BLADDER Height & Weight     Self, Place  Normal, O2 (2 liters) Continent, External catheter Weight: 72.6 kg Height:  5\' 9"  (175.3 cm)  BEHAVIORAL SYMPTOMS/MOOD NEUROLOGICAL BOWEL NUTRITION STATUS      Continent Diet (regular)  AMBULATORY STATUS COMMUNICATION OF NEEDS Skin   Extensive Assist Verbally Normal, Surgical wounds                       Personal Care Assistance Level of Assistance  Bathing, Dressing, Feeding Bathing Assistance: Limited assistance Feeding assistance: Independent Dressing Assistance: Maximum assistance     Functional Limitations Info  Hearing   Hearing Info: Impaired      SPECIAL CARE FACTORS FREQUENCY  PT (By licensed PT), OT (By licensed OT)     PT Frequency: 5 times per week OT  Frequency: 5 times per week            Contractures Contractures Info: Not present    Additional Factors Info  Code Status, Allergies Code Status Info: full Allergies Info: Darvon (Propoxyphene), Oxycodone-acetaminophen           Current Medications (12/15/2021):  This is the current hospital active medication list Current Facility-Administered Medications  Medication Dose Route Frequency Provider Last Rate Last Admin   0.9 %  sodium chloride infusion   Intravenous Continuous Hessie Knows, MD 100 mL/hr at 12/15/21 0658 New Bag at 12/15/21 0658   acetaminophen (TYLENOL) tablet 325-650 mg  325-650 mg Oral Q6H PRN Hessie Knows, MD       acetaminophen (TYLENOL) tablet 500 mg  500 mg Oral Q6H PRN Hessie Knows, MD       alum & mag hydroxide-simeth (MAALOX/MYLANTA) 200-200-20 MG/5ML suspension 30 mL  30 mL Oral Q4H PRN Hessie Knows, MD       aspirin chewable tablet 81 mg  81 mg Oral Daily Hessie Knows, MD   81 mg at 12/15/21 3570   atorvastatin (LIPITOR) tablet 40 mg  40 mg Oral Daily Hessie Knows, MD   40 mg at 12/15/21 1779   bisacodyl (DULCOLAX) suppository 10 mg  10 mg Rectal Daily PRN Hessie Knows, MD       diphenhydrAMINE (BENADRYL) 12.5 MG/5ML elixir 12.5-25 mg  12.5-25 mg Oral Q4H PRN Hessie Knows, MD       docusate sodium (COLACE) capsule 100  mg  100 mg Oral BID Hessie Knows, MD   100 mg at 12/15/21 0930   enoxaparin (LOVENOX) injection 40 mg  40 mg Subcutaneous Q24H Hessie Knows, MD   40 mg at 12/15/21 0930   feeding supplement (ENSURE ENLIVE / ENSURE PLUS) liquid 237 mL  237 mL Oral BID BM Hessie Knows, MD   237 mL at 12/15/21 0930   HYDROcodone-acetaminophen (NORCO/VICODIN) 5-325 MG per tablet 1-2 tablet  1-2 tablet Oral Q6H PRN Hessie Knows, MD       HYDROcodone-acetaminophen (NORCO/VICODIN) 5-325 MG per tablet 1-2 tablet  1-2 tablet Oral Q4H PRN Hessie Knows, MD       lactated ringers 1,000 mL with potassium chloride 40 mEq infusion   Intravenous Continuous Hessie Knows, MD 75 mL/hr at 12/14/21 1058 New Bag at 12/14/21 1058   levothyroxine (SYNTHROID) tablet 50 mcg  50 mcg Oral Q0600 Hessie Knows, MD   50 mcg at 12/15/21 0555   magnesium hydroxide (MILK OF MAGNESIA) suspension 30 mL  30 mL Oral QHS Hessie Knows, MD       memantine [facility]) tablet 10 mg  10 mg Oral BID Hessie Knows, MD   10 mg at 12/15/21 3818   menthol-cetylpyridinium (CEPACOL) lozenge 3 mg  1 lozenge Oral PRN Hessie Knows, MD       Or   phenol (CHLORASEPTIC) mouth spray 1 spray  1 spray Mouth/Throat PRN Hessie Knows, MD       methocarbamol (ROBAXIN) tablet 500 mg  500 mg Oral Q6H PRN Hessie Knows, MD       Or   methocarbamol (ROBAXIN) 500 mg in dextrose 5 % 50 mL IVPB  500 mg Intravenous Q6H PRN Hessie Knows, MD       metoCLOPramide (REGLAN) tablet 5-10 mg  5-10 mg Oral Q8H PRN Hessie Knows, MD       Or   metoCLOPramide (REGLAN) injection 5-10 mg  5-10 mg Intravenous Q8H PRN Hessie Knows, MD       metoprolol succinate (TOPROL-XL) 24 hr tablet 50 mg  50 mg Oral Daily Hessie Knows, MD   50 mg at 12/15/21 0929   montelukast (SINGULAIR) tablet 10 mg  10 mg Oral QHS Hessie Knows, MD       morphine 2 MG/ML injection 0.5 mg  0.5 mg Intravenous Q2H PRN Hessie Knows, MD   0.5 mg at 12/14/21 2993   morphine 4 MG/ML injection 0.52-1 mg  0.52-1 mg Intravenous Q2H PRN Hessie Knows, MD       multivitamin with minerals tablet 1 tablet  1 tablet Oral Daily Hessie Knows, MD   1 tablet at 12/15/21 0929   ondansetron (ZOFRAN) tablet 4 mg  4 mg Oral Q6H PRN Hessie Knows, MD       Or   ondansetron [facility]) injection 4 mg  4 mg Intravenous Q6H PRN Hessie Knows, MD       pantoprazole (PROTONIX) EC tablet 40 mg  40 mg Oral Daily Hessie Knows, MD   40 mg at 12/15/21 7169   sodium phosphate (FLEET) 7-19 GM/118ML enema 1 enema  1 enema Rectal Once PRN Hessie Knows, MD         Discharge Medications: Please see discharge summary for a list of discharge medications.  Relevant Imaging  Results:  Relevant Lab Results:   Additional Information SS# [id number]  Conception Oms, RN

## 2021-12-15 NOTE — Progress Notes (Addendum)
PROGRESS NOTE  Allison Ramos IWL:[id number] DOB: [birth year] DOA: 12/13/2021 PCP: Latanya Maudlin, NP  HPI/Recap of past 24 hours: Allison Ramos is a 80 y.o. female with medical history significant for Dementia, MGUS, HLD and osteoarthritis with history of recent falls who presented to [facility] ED following a fall.  Patient is unable to contribute to history due to dementia.  Work-up revealed left femoral neck fracture, fracture of superior and inferior pubic rami.  Post left femoral neck fracture repair on 12/14/2021 by orthopedic surgery Dr. [name].   12/15/2021: Patient seen and examined at bedside.  There were no acute events overnight.  She is alert and minimally interactive.  She denies having any pain.  Assessment/Plan: Principal Problem:   Recurrent falls Active Problems:   Left displaced femoral neck fracture (HCC)   Fracture of multiple pubic rami, left, closed, initial encounter ([place])   Mixed Alzheimer's and vascular dementia ([place])   Fall at home, initial encounter   MGUS (monoclonal gammopathy of unknown significance)   Closed left hip fracture (HCC)  Left displaced femoral neck fracture (HCC)   Fracture of superior and inferior pubic rami, left, closed, initial encounter  Presented after fall at home, with history of prior falls  - Continue pain control as needed with bowel regimen as needed - Further orders per orthopedics PT OT evaluation Fall precautions  Acute blood loss anemia postsurgery Baseline hemoglobin 13 Hemoglobin downtrending 9.1 Continue to closely monitor H&H  Leukocytosis, suspect reactive in the setting of recent surgery WBC is downtrending Afebrile and nonseptic appearing Continue to monitor   Alzheimer's and vascular dementia ([place]) - Continue [place] - Continue delirium precautions     MGUS (monoclonal gammopathy of unknown significance) - No acute disease - Follows with oncology  Hypothyroidism Continue home  levothyroxine  Hyperlipidemia Continue home Lipitor  Essential hypertension Continue home Toprol-XL  GERD Continue home Protonix   Physical debility/ambulatory dysfunction PT OT to assess Fall precautions   Code Status: Full code  Family Communication: None at bedside  Disposition Plan: Suspect will discharge to SNF   Consultants: Orthopedic surgery  Procedures: Left femoral neck repair.  Antimicrobials: None  DVT prophylaxis: Subcu Lovenox daily  Status is: Inpatient  Inpatient status.  Patient requires at least 2 midnights for further evaluation and treatment of present condition.      Objective: Vitals:   12/15/21 0108 12/15/21 0509 12/15/21 0739 12/15/21 1131  BP: (!) 107/59 (!) 122/56 108/65 (!) 140/58  Pulse: 90 94 (!) 106 100  Resp: 19 16 16 16   Temp: 97.9 F (36.6 C) 98.9 F (37.2 C) 98.7 F (37.1 C) 98.6 F (37 C)  TempSrc: Oral     SpO2: 100% 100% 99% 99%  Weight:      Height:        Intake/Output Summary (Last 24 hours) at 12/15/2021 1315 Last data filed at 12/15/2021 1740 Gross per 24 hour  Intake 2253.77 ml  Output 1000 ml  Net 1253.77 ml   Filed Weights   12/14/21 1405  Weight: 72.6 kg    Exam:  General: 80 y.o. year-old female well developed well nourished in no acute distress.  Alert and minimally interactive. Cardiovascular: Regular rate and rhythm with no rubs or gallops.  No thyromegaly or JVD noted.   Respiratory: Clear to auscultation with no wheezes or rales. Good inspiratory effort. Abdomen: Soft nontender nondistended with normal bowel sounds x4 quadrants. Musculoskeletal: No lower extremity edema bilaterally.   Skin: No ulcerative lesions noted or  rashes, Psychiatry: Mood is appropriate for condition and setting   Data Reviewed: CBC: Recent Labs  Lab 12/13/21 2329 12/14/21 2002 12/15/21 0434  WBC 11.7* 18.7* 15.0*  NEUTROABS 8.7*  --   --   HGB 13.0 10.2* 9.1*  HCT 38.4 30.0* 27.4*  MCV 87.1 88.2  88.4  PLT 163 146* 782*   Basic Metabolic Panel: Recent Labs  Lab 12/13/21 2329 12/14/21 2002 12/15/21 0434  NA 137  --  139  K 3.2*  --  3.5  CL 105  --  106  CO2 25  --  23  GLUCOSE 150*  --  164*  BUN 13  --  15  CREATININE 0.56 0.50 0.60  CALCIUM 8.7*  --  8.1*  MG  --   --  1.9  PHOS  --   --  3.6   GFR: Estimated Creatinine Clearance: 60.6 mL/min (by C-G formula based on SCr of 0.6 mg/dL). Liver Function Tests: No results for input(s): AST, ALT, ALKPHOS, BILITOT, PROT, ALBUMIN in the last 168 hours. No results for input(s): LIPASE, AMYLASE in the last 168 hours. No results for input(s): AMMONIA in the last 168 hours. Coagulation Profile: No results for input(s): INR, PROTIME in the last 168 hours. Cardiac Enzymes: No results for input(s): CKTOTAL, CKMB, CKMBINDEX, TROPONINI in the last 168 hours. BNP (last 3 results) No results for input(s): PROBNP in the last 8760 hours. HbA1C: No results for input(s): HGBA1C in the last 72 hours. CBG: No results for input(s): GLUCAP in the last 168 hours. Lipid Profile: No results for input(s): CHOL, HDL, LDLCALC, TRIG, CHOLHDL, LDLDIRECT in the last 72 hours. Thyroid Function Tests: No results for input(s): TSH, T4TOTAL, FREET4, T3FREE, THYROIDAB in the last 72 hours. Anemia Panel: No results for input(s): VITAMINB12, FOLATE, FERRITIN, TIBC, IRON, RETICCTPCT in the last 72 hours. Urine analysis:    Component Value Date/Time   COLORURINE YELLOW 12/14/2021 1248   APPEARANCEUR CLEAR (A) 12/14/2021 1248   LABSPEC 1.025 12/14/2021 1248   PHURINE 6.5 12/14/2021 1248   GLUCOSEU NEGATIVE 12/14/2021 1248   HGBUR TRACE (A) 12/14/2021 1248   BILIRUBINUR NEGATIVE 12/14/2021 1248   KETONESUR NEGATIVE 12/14/2021 1248   PROTEINUR NEGATIVE 12/14/2021 1248   NITRITE NEGATIVE 12/14/2021 1248   LEUKOCYTESUR NEGATIVE 12/14/2021 1248   Sepsis Labs: @LABRCNTIP (procalcitonin:4,lacticidven:4)  ) Recent Results (from the past 240 hour(s))   Resp Panel by RT-PCR (Flu A&B, Covid) Nasopharyngeal Swab     Status: None   Collection Time: 12/14/21 12:59 AM   Specimen: Nasopharyngeal Swab; Nasopharyngeal(NP) swabs in vial transport medium  Result Value Ref Range Status   SARS Coronavirus 2 by RT PCR NEGATIVE NEGATIVE Final    Comment: (NOTE) SARS-CoV-2 target nucleic acids are NOT DETECTED.  The SARS-CoV-2 RNA is generally detectable in upper respiratory specimens during the acute phase of infection. The lowest concentration of SARS-CoV-2 viral copies this assay can detect is 138 copies/mL. A negative result does not preclude SARS-Cov-2 infection and should not be used as the sole basis for treatment or other patient management decisions. A negative result may occur with  improper specimen collection/handling, submission of specimen other than nasopharyngeal swab, presence of viral mutation(s) within the areas targeted by this assay, and inadequate number of viral copies(<138 copies/mL). A negative result must be combined with clinical observations, patient history, and epidemiological information. The expected result is Negative.  Fact Sheet for Patients:  EntrepreneurPulse.com.au  Fact Sheet for Healthcare Providers:  IncredibleEmployment.be  This test is no  t yet approved or cleared by the [place] and  has been authorized for detection and/or diagnosis of SARS-CoV-2 by FDA under an Emergency Use Authorization (EUA). This EUA will remain  in effect (meaning this test can be used) for the duration of the COVID-19 declaration under Section 564(b)(1) of the Act, 21 U.S.C.section 360bbb-3(b)(1), unless the authorization is terminated  or revoked sooner.       Influenza A by PCR NEGATIVE NEGATIVE Final   Influenza B by PCR NEGATIVE NEGATIVE Final    Comment: (NOTE) The Xpert Xpress SARS-CoV-2/FLU/RSV plus assay is intended as an aid in the diagnosis of influenza from  Nasopharyngeal swab specimens and should not be used as a sole basis for treatment. Nasal washings and aspirates are unacceptable for Xpert Xpress SARS-CoV-2/FLU/RSV testing.  Fact Sheet for Patients: EntrepreneurPulse.com.au  Fact Sheet for Healthcare Providers: IncredibleEmployment.be  This test is not yet approved or cleared by the [place] FDA and has been authorized for detection and/or diagnosis of SARS-CoV-2 by FDA under an Emergency Use Authorization (EUA). This EUA will remain in effect (meaning this test can be used) for the duration of the COVID-19 declaration under Section 564(b)(1) of the Act, 21 U.S.C. section 360bbb-3(b)(1), unless the authorization is terminated or revoked.  Performed at [facility], [address] [place] , [place] [address]       Studies: DG C-Arm 1-60 Min-No Report  Result Date: 12/14/2021 Fluoroscopy was utilized by the requesting physician.  No radiographic interpretation.   DG HIP UNILAT WITH PELVIS 1V LEFT  Result Date: 12/14/2021 CLINICAL DATA:  Left femoral neck fracture EXAM: DG HIP (WITH OR WITHOUT PELVIS) 1V*L* COMPARISON:  12/13/2021 FLUOROSCOPY TIME:  Radiation Exposure Index (as provided by the fluoroscopic device): 0.76 mGy If the device does not provide the exposure index: Fluoroscopy Time:  7 seconds Number of Acquired Images:  2 FINDINGS: Initial image again demonstrates left femoral neck fracture. Subsequent image shows a left hip replacement in satisfactory position. IMPRESSION: Left hip replacement Electronically Signed   By: Inez Catalina M.D.   On: 12/14/2021 17:17   DG HIP UNILAT W OR W/O PELVIS 2-3 VIEWS LEFT  Result Date: 12/14/2021 CLINICAL DATA:  S/p left total hip replacement with cement EXAM: DG HIP (WITH OR WITHOUT PELVIS) 2-3V LEFT COMPARISON:  X-ray hip left 12/13/2021 FINDINGS: Status post total left hip arthroplasty. Subcutaneus soft tissue edema and emphysema  consistent with surgical changes. Overlying skin staples. Overlying wound VAC. Redemonstration of inferior and superior pubic rami acute nondisplaced/minimally displaced fractures. There is no evidence of severe arthropathy or other focal bone abnormality. IMPRESSION: 1. Status post total left hip arthroplasty. 2. Redemonstration of left inferior and superior pubic rami acute nondisplaced/minimally displaced fractures. Electronically Signed   By: Iven Finn M.D.   On: 12/14/2021 17:57    Scheduled Meds:  aspirin  81 mg Oral Daily   atorvastatin  40 mg Oral Daily   docusate sodium  100 mg Oral BID   enoxaparin (LOVENOX) injection  40 mg Subcutaneous Q24H   feeding supplement  237 mL Oral BID BM   levothyroxine  50 mcg Oral Q0600   magnesium hydroxide  30 mL Oral QHS   memantine  10 mg Oral BID   metoprolol succinate  50 mg Oral Daily   montelukast  10 mg Oral QHS   multivitamin with minerals  1 tablet Oral Daily   pantoprazole  40 mg Oral Daily    Continuous Infusions:  sodium chloride  100 mL/hr at 12/15/21 4461   lactated ringers with kcl 75 mL/hr at 12/14/21 1058   methocarbamol (ROBAXIN) IV       LOS: 1 day     Kayleen Memos, MD [facility] Pager [phone]  If 7PM-7AM, please contact night-coverage [email or web address] Password [email or web address] 12/15/2021, 1:15 PM

## 2021-12-15 NOTE — Anesthesia Postprocedure Evaluation (Signed)
Anesthesia Post Note  Patient: Allison Ramos  Procedure(s) Performed: TOTAL HIP ARTHROPLASTY ANTERIOR APPROACH (Left: Hip)  Patient location during evaluation: Nursing Unit Anesthesia Type: Spinal Level of consciousness: responds to stimulation and patient cooperative Pain management: pain level controlled Vital Signs Assessment: post-procedure vital signs reviewed and stable Respiratory status: spontaneous breathing, nonlabored ventilation and respiratory function stable Cardiovascular status: stable Anesthetic complications: no Comments: Dementia per preop   No notable events documented.   Last Vitals:  Vitals:   12/15/21 0509 12/15/21 0739  BP: (!) 122/56 108/65  Pulse: 94 (!) 106  Resp: 16 16  Temp: 37.2 C 37.1 C  SpO2: 100% 99%    Last Pain:  Vitals:   12/15/21 0108  TempSrc: Oral  PainSc:                  [name]

## 2021-12-15 NOTE — Progress Notes (Signed)
° °  Subjective: 1 Day Post-Op Procedure(s) (LRB): TOTAL HIP ARTHROPLASTY ANTERIOR APPROACH (Left) Patient reports pain as moderate.   Patient is well, and has had no acute complaints or problems Denies any CP, SOB, ABD pain. We will continue therapy today.  Plan is to go Skilled nursing facility after hospital stay.  Objective: Vital signs in last 24 hours: Temp:  [96.9 F (36.1 C)-98.9 F (37.2 C)] 98.7 F (37.1 C) (12/23 0739) Pulse Rate:  [60-106] 106 (12/23 0739) Resp:  [14-23] 16 (12/23 0739) BP: (75-183)/(39-122) 108/65 (12/23 0739) SpO2:  [84 %-100 %] 99 % (12/23 0739) Weight:  [72.6 kg] 72.6 kg (12/22 1405)  Intake/Output from previous day: 12/22 0701 - 12/23 0700 In: 2253.8 [I.V.:2153.8; IV Piggyback:100] Out: 1000 [Urine:650; Blood:350] Intake/Output this shift: No intake/output data recorded.  Recent Labs    12/13/21 2329 12/14/21 2002 12/15/21 0434  HGB 13.0 10.2* 9.1*   Recent Labs    12/14/21 2002 12/15/21 0434  WBC 18.7* 15.0*  RBC 3.40* 3.10*  HCT 30.0* 27.4*  PLT 146* 131*   Recent Labs    12/13/21 2329 12/14/21 2002 12/15/21 0434  NA 137  --  139  K 3.2*  --  3.5  CL 105  --  106  CO2 25  --  23  BUN 13  --  15  CREATININE 0.56 0.50 0.60  GLUCOSE 150*  --  164*  CALCIUM 8.7*  --  8.1*   No results for input(s): LABPT, INR in the last 72 hours.  EXAM General - Patient is Alert, Appropriate, and Oriented Extremity - Neurovascular intact Sensation intact distally Intact pulses distally Dorsiflexion/Plantar flexion intact Dressing - dressing C/D/I and no drainage, Praveena intact without drainage Motor Function - intact, moving foot and toes well on exam.   Past Medical History:  Diagnosis Date   Dementia ([place])    Hyperlipidemia    Osteoarthrosis     Assessment/Plan:   1 Day Post-Op Procedure(s) (LRB): TOTAL HIP ARTHROPLASTY ANTERIOR APPROACH (Left) Principal Problem:   Recurrent falls Active Problems:   Left displaced  femoral neck fracture (HCC)   Fracture of multiple pubic rami, left, closed, initial encounter (HCC)   Mixed Alzheimer's and vascular dementia ([place])   Fall at home, initial encounter   MGUS (monoclonal gammopathy of unknown significance)   Closed left hip fracture (HCC)  Estimated body mass index is 23.63 kg/m as calculated from the following:   Height as of this encounter: 5\' 9"  (1.753 m).   Weight as of this encounter: 72.6 kg. Advance diet Up with therapy Vital signs are stable Labs are stable Recheck hemoglobin in the morning Pain well controlled Care management to assist with discharge to skilled nursing facility   Follow-up with [facility] orthopedics in 2 weeks Remove Praveena and apply honeycomb dressing 7 days following discharge from the hospital Lovenox 40 mg subcu daily at discharge x14 days   DVT Prophylaxis - Lovenox, TED hose, and SCDs Weight-Bearing as tolerated to left leg   T. Rachelle Hora, PA-C [place] 12/15/2021, 9:52 AM

## 2021-12-15 NOTE — TOC Progression Note (Signed)
Transition of Care [facility]) - Progression Note    Patient Details  Name: Allison Ramos MRN: [id number] Date of Birth: 1941/03/28  Transition of [facility]) CM/SW Alfred, RN Phone Number: 12/15/2021, 4:50 PM  Clinical Narrative:   the patient was on a video call with her daughter, I met with them in the room while on the video call, Daughter agrees that the patient will go to STR SNF at [place], agrees to bed search, PASSR obtained, Fl2 completed, Bedsearch done, review offers once obtained         Expected Discharge Plan and Services                                                 Social Determinants of Health (SDOH) Interventions    Readmission Risk Interventions No flowsheet data found.

## 2021-12-15 NOTE — Evaluation (Signed)
Occupational Therapy Evaluation Patient Details Name: Allison Ramos MRN: [id number] DOB: 12/31/1940 Today's Date: 12/15/2021   History of Present Illness Pt is a 80 y/o F with PMH: alzheimers, HLD, and OA who presented to ED after fall w/ c/o L hip pain. Found to have displaced L femoral neck fx, now s/p L THA on 12/22 [name]).   Clinical Impression   Pt seen for OT evaluation this date in setting of acute hospitalization d/t fall, now s/p L hip THA (anterior). Pt with no family present at this time and demos very limited verbalization with therapist to ascertain any personal information. Pt oriented x0. Pt presents this date with pain somewhat limiting mobilization in addition to cognitive status and baseline weakness. She requires TOTAL A to come to EOB sitting and is initially moaning seemingly d/t L hip pain, but is noted to become unresponsive even though telemetry is seemingly stable (her HR does hit 120bpm briefly with sup to sit transition, but is otherwise [facility]). She is immediately returned to bed and seemingly returns to her baseline limited responsiveness, but at least making some intermittent eye contact and occasionally responding "yeah". RN presents to provide meds and is updated on limited session. Any further mobilization deferred for safety.  OT elevates HOB and sets up tray for breakfast, but pt seemingly unable to sequence self-feeding, requiring MAX A. Requires TOTAL A for all LB ADLs including donning socks, unable to even contribute to elevating B LE from bed to contribute. RN/CNA updated on this as well. Pt left with all needs met and in reach. PT updated via secure chat as well. Very limited ability to assess function today. Anticipate pt will require STR, but that will be dependent on pt's ability to participate. Should her tolerance/participation levels not be able to improve before d/c, LTC might be more viable option.      Recommendations for follow up therapy are one  component of a multi-disciplinary discharge planning process, led by the attending physician.  Recommendations may be updated based on patient status, additional functional criteria and insurance authorization.   Follow Up Recommendations  Skilled nursing-short term rehab (<3 hours/day)    Assistance Recommended at Discharge Frequent or constant Supervision/Assistance  Functional Status Assessment  Patient has had a recent decline in their functional status and demonstrates the ability to make significant improvements in function in a reasonable and predictable amount of time.  Equipment Recommendations  Other (comment) (TBD when pt more able to participate)    Recommendations for Other Services       Precautions / Restrictions Precautions Precautions: Fall;Anterior Hip Restrictions Weight Bearing Restrictions: Yes LLE Weight Bearing: Weight bearing as tolerated      Mobility Bed Mobility Overal bed mobility: Needs Assistance Bed Mobility: Supine to Sit;Sit to Supine     Supine to sit: Total assist Sit to supine: Total assist   General bed mobility comments: pt is essentially unable to meaningfully contribute to coming to sitting despite efforts to gently encourage her to advance any extremity toward EOB.    Transfers                   General transfer comment: deferred, unsafe      Balance Overall balance assessment: Needs assistance   Sitting balance-Leahy Scale: Zero Sitting balance - Comments: MAX A to sustain static sitting EOB and really only tolerates <10 seconds d/t grimmacing/guarding and then ultimately not responding any more although eyes still open and HR/RR seemingly stable (120bpm with  sup to sit transition, but otherwise WNL).       Standing balance comment: deferred, unsafe                           ADL either performed or assessed with clinical judgement   ADL Overall ADL's : Needs assistance/impaired                                        General ADL Comments: requires MAX A for UB ADLs bed level uncluding self-feeding and TOTAL A for bed level LB ADLs including donning socks. She requires TOTAL A to come to EOB sitting.     Vision   Additional Comments: poor visual attention, unable to fully ascertain visual assessment     Perception     Praxis      Pertinent Vitals/Pain Pain Assessment: Faces Faces Pain Scale: Hurts even more Breathing: normal Negative Vocalization: occasional moan/groan, low speech, negative/disapproving quality (w/ mobilization) Facial Expression: facial grimacing (w/ mob) Body Language: tense, distressed pacing, fidgeting (w/ mobilization) Consolability: distracted or reassured by voice/touch (w/ mob) PAINAD Score: 5 Pain Location: L hip w/ mobilization Pain Descriptors / Indicators: Grimacing;Guarding;Moaning Pain Intervention(s): Limited activity within patient's tolerance;Monitored during session;Repositioned;Other (comment) (RN notified)     Hand Dominance     Extremity/Trunk Assessment Upper Extremity Assessment Upper Extremity Assessment: Generalized weakness;Difficult to assess due to impaired cognition   Lower Extremity Assessment Lower Extremity Assessment: Generalized weakness;Difficult to assess due to impaired cognition;LLE deficits/detail LLE: Unable to fully assess due to pain       Communication     Cognition Arousal/Alertness:  (awake, but not necessarily alert, eyes are open, but limited visual attention.) Behavior During Therapy: Flat affect Overall Cognitive Status: No family/caregiver present to determine baseline cognitive functioning                                 General Comments: Pt with very very few verbalizations and only discernable word through entirety of session is "yeah". Minimal visual attention, when she does attend, it's when she is responding to painful stimuli, but she also has periods of blank stares  including when being returned to bed which is concerning because one would expect that to be painful stimuli. OT notifies RN.     General Comments  RN notified of pt response to very limited assessment of mobility to EOB sitting    Exercises     Shoulder Instructions      Home Living Family/patient expects to be discharged to:: Unsure                                 Additional Comments: pt is poor historian, unable to provide any background information, no family present. Chart indicates pt from home.      Prior Functioning/Environment               Mobility Comments: apparently ambulatory at baseline, but unclear from chart whether pt used AD, no family present to confirm          OT Problem List:        OT Treatment/Interventions: Self-care/ADL training;Therapeutic exercise;DME and/or AE instruction;Therapeutic activities;Patient/family education;Balance training    OT Goals(Current goals can be found  in the care plan section) Acute Rehab OT Goals Patient Stated Goal: none stated OT Goal Formulation: Patient unable to participate in goal setting Time For Goal Achievement: 12/29/21 Potential to Achieve Goals: Poor ADL Goals Additional ADL Goal #1: Pt will self-feed with MIN A in supported sitting Additional ADL Goal #2: Pt will complete 1 familiar grooming task in EOB sitting with F balance Additional ADL Goal #3: Pt will sequence 1 familiar ADL task with <25% multimodal cues.  OT Frequency: Min 2X/week   Barriers to D/C:            Co-evaluation              AM-PAC OT "6 Clicks" Daily Activity     Outcome Measure Help from another person eating meals?: A Lot Help from another person taking care of personal grooming?: A Lot Help from another person toileting, which includes using toliet, bedpan, or urinal?: Total Help from another person bathing (including washing, rinsing, drying)?: Total Help from another person to put on and taking  off regular upper body clothing?: A Lot Help from another person to put on and taking off regular lower body clothing?: Total 6 Click Score: 9   End of Session Nurse Communication: Mobility status;Other (comment) (feeder)  Activity Tolerance: Treatment limited secondary to medical complications (Comment);Other (comment) (limited 2/2 cognition) Patient left: in bed;with call bell/phone within reach;with bed alarm set (HOB elevated and tray setup in an attempt to encourage pt to eat, but requires MAX A so CNA notified)  OT Visit Diagnosis: Unsteadiness on feet (R26.81);History of falling (Z91.81)                Time: 1700-1749 OT Time Calculation (min): 24 min Charges:  OT General Charges $OT Visit: 1 Visit OT Evaluation $OT Eval Moderate Complexity: 1 Mod OT Treatments $Self Care/Home Management : 8-22 mins  Gerrianne Scale, MS, OTR/L ascom [phone] 12/15/21, 11:25 AM

## 2021-12-15 NOTE — Evaluation (Signed)
Physical Therapy Evaluation Patient Details Name: Allison Ramos MRN: [id number] DOB: [birth year] Today's Date: 12/15/2021  History of Present Illness  Pt is a 80 y/o F with PMH: alzheimers, HLD, and OA who presented to ED after fall w/ c/o L hip pain. Found to have displaced L femoral neck fx, now s/p L THA (anterior approach) on 12/22 [name]).  Clinical Impression  Pt received supine in bed, eyes open. She was able to state her name; otherwise not oriented (and/or did not verbalize when asked). She did speak a few short phrases at appropriate times such as "wait a minute" when mobilizing towards EOB; unable to verbalize thoughts, feelings, pain. She was a total assist x2 for bed mobility with pt not attempting to assist through any of the movement. She did show active movement of RLE in response to pain with hip and knee flexion. Unable to formally assess strength as pt was unable to follow commands. Once sitting EOB, pt demo forward posture and leftward lean. The posterior LOB requiring MAX A to correct and redirect back to bed. Overall, session limited by cognition and pain. According to chart,  pt was able to ambulate prior to this fall. Currently recommending STR at d/c however if pt is unable to begin participating with PT, d/c may need to begin looking into long-term care. Would benefit from skilled PT to address above deficits and promote optimal return to PLOF.      Recommendations for follow up therapy are one component of a multi-disciplinary discharge planning process, led by the attending physician.  Recommendations may be updated based on patient status, additional functional criteria and insurance authorization.  Follow Up Recommendations Skilled nursing-short term rehab (<3 hours/day)    Assistance Recommended at Discharge Frequent or constant Supervision/Assistance  Functional Status Assessment Patient has had a recent decline in their functional status and/or demonstrates  limited ability to make significant improvements in function in a reasonable and predictable amount of time  Equipment Recommendations  None recommended by PT (TBD at next venue of care)    Recommendations for Other Services       Precautions / Restrictions Precautions Precautions: Fall;Anterior Hip Restrictions Weight Bearing Restrictions: Yes LLE Weight Bearing: Weight bearing as tolerated      Mobility  Bed Mobility Overal bed mobility: Needs Assistance Bed Mobility: Supine to Sit;Sit to Supine     Supine to sit: Total assist;+2 for physical assistance;+2 for safety/equipment Sit to supine: Total assist;+2 for physical assistance;+2 for safety/equipment   General bed mobility comments: Pt did not contribute to efforts (cognition?) of sitting up to EOB or returning to bed. CNA assisted for pt and therapist safety. Active movement of RLE witnessed (hip and knee flexion) in reaction to pain.    Transfers                   General transfer comment: deferred, unsafe    Ambulation/Gait                  Stairs            Wheelchair Mobility    Modified Rankin (Stroke Patients Only)       Balance Overall balance assessment: Needs assistance   Sitting balance-Leahy Scale: Poor Sitting balance - Comments: Intermittant CGA-MAX A to remain sitting EOB. She leaned forward and to the left while moaning; then leaned backwards woth no trunk control - PT preventing posterior LOB.       Standing balance comment: deferred, unsafe  Pertinent Vitals/Pain Pain Assessment: Faces Faces Pain Scale: Hurts whole lot Breathing: normal Negative Vocalization: occasional moan/groan, low speech, negative/disapproving quality Facial Expression: facial grimacing Body Language: tense, distressed pacing, fidgeting Consolability: distracted or reassured by voice/touch PAINAD Score: 5 Pain Location: L hip w/ mobilization Pain  Descriptors / Indicators: Grimacing;Guarding;Moaning Pain Intervention(s): Limited activity within patient's tolerance;Monitored during session;Repositioned    Home Living Family/patient expects to be discharged to:: Unsure                   Additional Comments: pt is poor historian, unable to provide any background information, no family present. Chart indicates pt from home.    Prior Function               Mobility Comments: apparently ambulatory at baseline, but unclear from chart whether pt used AD, no family present to confirm       Hand Dominance        Extremity/Trunk Assessment   Upper Extremity Assessment Upper Extremity Assessment: Generalized weakness;Difficult to assess due to impaired cognition    Lower Extremity Assessment Lower Extremity Assessment: Generalized weakness;Difficult to assess due to impaired cognition LLE: Unable to fully assess due to pain       Communication      Cognition Arousal/Alertness: Awake/alert (awake, but not necessarily alert, eyes are open, but limited visual attention.) Behavior During Therapy: Flat affect Overall Cognitive Status: No family/caregiver present to determine baseline cognitive functioning                                 General Comments: Pt verbalized minimally. She did state her name and say that she was at home; non-verbal during other orientation questions. She did speak a sentence that was unintelligable and repeatedly said "wait a minute" when perofmring bed mobility. She does acknowledge when PT speaks her name and says "yeah" multiple times within session. She reacts to painful stimuli (moving the LLE). RN aware as OT notifed earlier this morning.        General Comments General comments (skin integrity, edema, etc.): RN notified of pt response to very limited assessment of mobility to EOB sitting    Exercises     Assessment/Plan    PT Assessment Patient needs continued PT  services  PT Problem List Decreased strength;Decreased mobility;Decreased safety awareness;Decreased range of motion;Decreased activity tolerance;Decreased cognition;Decreased balance       PT Treatment Interventions DME instruction;Therapeutic activities;Cognitive remediation;Gait training;Therapeutic exercise;Patient/family education;Stair training;Balance training;Functional mobility training;Neuromuscular re-education    PT Goals (Current goals can be found in the Care Plan section)  Acute Rehab PT Goals PT Goal Formulation: Patient unable to participate in goal setting    Frequency 7X/week   Barriers to discharge Other (comment) unsure of home environment; total assist for all mobility    Co-evaluation               AM-PAC PT "6 Clicks" Mobility  Outcome Measure Help needed turning from your back to your side while in a flat bed without using bedrails?: Total Help needed moving from lying on your back to sitting on the side of a flat bed without using bedrails?: Total Help needed moving to and from a bed to a chair (including a wheelchair)?: Total Help needed standing up from a chair using your arms (e.g., wheelchair or bedside chair)?: Total Help needed to walk in hospital room?: Total Help needed climbing 3-5 steps with  a railing? : Total 6 Click Score: 6    End of Session Equipment Utilized During Treatment: Oxygen Activity Tolerance: Patient limited by pain;Other (comment) (limited by cognition) Patient left: in bed;with nursing/sitter in room;with call bell/phone within reach;with bed alarm set;with SCD's reapplied Nurse Communication: Mobility status PT Visit Diagnosis: Muscle weakness (generalized) (M62.81);Adult, failure to thrive (R62.7)    Time: 2072-1828 PT Time Calculation (min) (ACUTE ONLY): 21 min   Charges:   PT Evaluation $PT Eval Moderate Complexity: 1 Mod PT Treatments $Therapeutic Activity: 8-22 mins        Patrina Levering PT,  DPT 12/15/21 1:23 PM [phone]

## 2021-12-16 DIAGNOSIS — D62 Acute posthemorrhagic anemia: Secondary | ICD-10-CM

## 2021-12-16 DIAGNOSIS — I10 Essential (primary) hypertension: Secondary | ICD-10-CM | POA: Diagnosis not present

## 2021-12-16 DIAGNOSIS — S72002A Fracture of unspecified part of neck of left femur, initial encounter for closed fracture: Secondary | ICD-10-CM | POA: Diagnosis not present

## 2021-12-16 DIAGNOSIS — E039 Hypothyroidism, unspecified: Secondary | ICD-10-CM | POA: Diagnosis not present

## 2021-12-16 LAB — HEMOGLOBIN AND HEMATOCRIT, BLOOD
HCT: 24.8 % — ABNORMAL LOW (ref 36.0–46.0)
Hemoglobin: 8.5 g/dL — ABNORMAL LOW (ref 12.0–15.0)

## 2021-12-16 LAB — COMPREHENSIVE METABOLIC PANEL
ALT: 12 U/L (ref 0–44)
AST: 19 U/L (ref 15–41)
Albumin: 2.9 g/dL — ABNORMAL LOW (ref 3.5–5.0)
Alkaline Phosphatase: 44 U/L (ref 38–126)
Anion gap: 4 — ABNORMAL LOW (ref 5–15)
BUN: 22 mg/dL (ref 8–23)
CO2: 27 mmol/L (ref 22–32)
Calcium: 8.1 mg/dL — ABNORMAL LOW (ref 8.9–10.3)
Chloride: 106 mmol/L (ref 98–111)
Creatinine, Ser: 0.53 mg/dL (ref 0.44–1.00)
GFR, Estimated: >60 mL/min (ref >60)
Glucose, Bld: 148 mg/dL — ABNORMAL HIGH (ref 70–99)
Potassium: 4 mmol/L (ref 3.5–5.1)
Sodium: 137 mmol/L (ref 135–145)
Total Bilirubin: 0.8 mg/dL (ref 0.3–1.2)
Total Protein: 5.6 g/dL — ABNORMAL LOW (ref 6.5–8.1)

## 2021-12-16 LAB — CBC
HCT: 21.2 % — ABNORMAL LOW (ref 36.0–46.0)
Hemoglobin: 7 g/dL — ABNORMAL LOW (ref 12.0–15.0)
MCH: 29.2 pg (ref 26.0–34.0)
MCHC: 33 g/dL (ref 30.0–36.0)
MCV: 88.3 fL (ref 80.0–100.0)
Platelets: 120 K/uL — ABNORMAL LOW (ref 150–400)
RBC: 2.4 MIL/uL — ABNORMAL LOW (ref 3.87–5.11)
RDW: 13.9 % (ref 11.5–15.5)
WBC: 12.6 K/uL — ABNORMAL HIGH (ref 4.0–10.5)
nRBC: 0 % (ref 0.0–0.2)

## 2021-12-16 LAB — PREPARE RBC (CROSSMATCH)

## 2021-12-16 LAB — ABO/RH: ABO/RH(D): O POS

## 2021-12-16 NOTE — Assessment & Plan Note (Addendum)
Follows with [facility] at [facility]

## 2021-12-16 NOTE — Hospital Course (Addendum)
Allison Ramos is a 80 y.o. female with medical history significant for Dementia, MGUS, HLD and osteoarthritis with history of recent falls who presented to [facility] ED following a fall.  Patient is unable to contribute to history due to dementia.  Work-up revealed left femoral neck fracture, fracture of superior and inferior pubic rami.  Post left femoral neck fracture repair on 12/14/2021 by orthopedic surgery Dr. [name].   12/15/2021: no acute events overnight.  She is alert and minimally interactive.  She denies having any pain. 12/16/21 - Hb 7 requiring 1 PRBC transfusion  12/25: Hemoglobin 7.6 today.  Patient denies any obvious GI bleed, holding Lovenox 12/26: Patient is combative and agitated since last night 12/27 : Stopped Risperdal and Haldol per daughter's request.  Daughter would like to get call for any new medication before starting

## 2021-12-16 NOTE — Assessment & Plan Note (Addendum)
Continue synthroid.

## 2021-12-16 NOTE — Progress Notes (Signed)
°  Subjective: 2 Days Post-Op Procedure(s) (LRB): TOTAL HIP ARTHROPLASTY ANTERIOR APPROACH (Left) Patient reports pain as well-controlled.   Patient is well, and has had no acute complaints or problems Plan is to go Skilled nursing facility after hospital stay. Negative for chest pain and shortness of breath Fever: no   Objective: Vital signs in last 24 hours: Temp:  [97.9 F (36.6 C)-100 F (37.8 C)] 98.7 F (37.1 C) (12/24 0730) Pulse Rate:  [100-124] 109 (12/24 0730) Resp:  [16-18] 17 (12/24 0730) BP: (109-140)/(47-61) 130/54 (12/24 0730) SpO2:  [92 %-99 %] 97 % (12/24 0730)  Intake/Output from previous day:  Intake/Output Summary (Last 24 hours) at 12/16/2021 1017 Last data filed at 12/16/2021 0553 Gross per 24 hour  Intake --  Output 0 ml  Net 0 ml    Intake/Output this shift: No intake/output data recorded.  Labs: Recent Labs    12/13/21 2329 12/14/21 2002 12/15/21 0434 12/16/21 0509  HGB 13.0 10.2* 9.1* 7.0*   Recent Labs    12/15/21 0434 12/16/21 0509  WBC 15.0* 12.6*  RBC 3.10* 2.40*  HCT 27.4* 21.2*  PLT 131* 120*   Recent Labs    12/15/21 0434 12/16/21 0509  NA 139 137  K 3.5 4.0  CL 106 106  CO2 23 27  BUN 15 22  CREATININE 0.60 0.53  GLUCOSE 164* 148*  CALCIUM 8.1* 8.1*   No results for input(s): LABPT, INR in the last 72 hours.   EXAM General - Patient is Alert Extremity - Neurovascular intact Dorsiflexion/Plantar flexion intact Compartment soft Dressing/Incision -clean, dry, Prevena in place, no drainage in cannister Motor Function - intact, moving foot and toes well on exam.     Assessment/Plan: 2 Days Post-Op Procedure(s) (LRB): TOTAL HIP ARTHROPLASTY ANTERIOR APPROACH (Left) Principal Problem:   Recurrent falls Active Problems:   Left displaced femoral neck fracture (HCC)   Fracture of multiple pubic rami, left, closed, initial encounter (HCC)   Mixed Alzheimer's and vascular dementia ([place])   Fall at home, initial  encounter   MGUS (monoclonal gammopathy of unknown significance)   Closed left hip fracture (HCC)  Estimated body mass index is 23.63 kg/m as calculated from the following:   Height as of this encounter: 5\' 9"  (1.753 m).   Weight as of this encounter: 72.6 kg. Advance diet Up with therapy  Will hold Lovenox per medicine request as she is going to be transfused today.   SCDs in place and working, TED hose in place.   DVT Prophylaxis - Ted hose and SCDs Weight-Bearing as tolerated to left leg  Cassell Smiles, PA-C [facility] Orthopaedic Surgery 12/16/2021, 10:17 AM

## 2021-12-16 NOTE — Assessment & Plan Note (Addendum)
S/p fall. Underwent THA on 12/22. Pain is controlled. Waiting for SNF placement.  Holding Lovenox due to hemoglobin trending down.  SCDs for now

## 2021-12-16 NOTE — Assessment & Plan Note (Addendum)
Multifactorial. PT, OT recommends SNF and TOC working on placement, Daughter interested in memory care unit

## 2021-12-16 NOTE — Assessment & Plan Note (Addendum)
Hemoglobin trending down Hb 13->7> 8.5>7.6> 8.8>7.8 status post 2 PRBC transfusion this admission  Monitor for any s/s of Bleed. No obvious GI bleed. Hold Lovenox and place SCDs, avoid NSAIDs.  CT abdomen/pelvis shows stool burden and left hip Subcutaneous and intramuscular complex fluid, likely hemorrhage..  Discussed with Ortho who does not feel any surgical intervention needed for now. Daughter Webb Silversmith) is requesting Ortho to call her

## 2021-12-16 NOTE — Assessment & Plan Note (Addendum)
continue Toprol-XL

## 2021-12-16 NOTE — Progress Notes (Signed)
PT Cancellation Note  Patient Details Name: Allison Ramos MRN: [id number] DOB: 1941/06/13   Cancelled Treatment:    Reason Eval/Treat Not Completed: Medical issues which prohibited therapy Per MD HGB too low. MD reports pt to be seen when HGB improves.    Particia Lather 12/16/2021, 3:23 PM

## 2021-12-16 NOTE — Assessment & Plan Note (Addendum)
Continue Namenda. Overall poor prognosis.

## 2021-12-16 NOTE — Progress Notes (Signed)
°  Progress Note   Patient: Allison Ramos JKD:[id number] DOB: 1941-03-01 DOA: 12/13/2021     2 DOS: the patient was seen and examined on 12/16/2021   Brief hospital course: Kynley Metzger is a 80 y.o. female with medical history significant for Dementia, MGUS, HLD and osteoarthritis with history of recent falls who presented to [facility] ED following a fall.  Patient is unable to contribute to history due to dementia.  Work-up revealed left femoral neck fracture, fracture of superior and inferior pubic rami.  Post left femoral neck fracture repair on 12/14/2021 by orthopedic surgery Dr. [name].   12/15/2021: no acute events overnight.  She is alert and minimally interactive.  She denies having any pain. 12/16/21 - Hb 7 requiring 1 PRBC transfusion   Assessment and Plan * Left displaced femoral neck fracture ([place])- (present on admission) S/p fall. Underwent THA on 12/22. Pain is controlled. Waiting for SNF placement  Acute blood loss as cause of postoperative anemia Baseline Hb 13->7 1 PRBC transfusion today. Monitor for any s/s of Bleed. No obvious GI bleed. May consider GI c/s if further drop in Hb  Benign essential HTN- (present on admission) continue Toprol-XL  Hypothyroidism- (present on admission) Continue synthroid  MGUS (monoclonal gammopathy of unknown significance)- (present on admission) Follows with onco  Recurrent falls Multifactorial. PT, OT recommends SNF and TOC working on placement  Mixed Alzheimer's and vascular dementia ([place])- (present on admission) Continue Namenda. Overall poor prognosis     Subjective: no new c/o, feeling weak  Objective Vital signs were reviewed and unremarkable except tachycardia.  80 y.o. year-old female well developed well nourished in no acute distress.  Alert and minimally interactive. Cardiovascular: Regular rate and rhythm with no rubs or gallops.  No thyromegaly or JVD noted.   Respiratory: Clear to auscultation with no  wheezes or rales. Good inspiratory effort. Abdomen: Soft, benign Musculoskeletal: No lower extremity edema bilaterally.   Skin: No ulcerative lesions noted or rashes, Psychiatry: Mood is normal  Data Reviewed: Anemia with Hb 7  Family Communication: none at bedside. Will attempt tomorrow  Disposition: Status is: Inpatient  Remains inpatient appropriate because: waiting for SNF. Receiving 1 PRBC transfusion for anemia  DVT prophylaxis - scd    Time spent: 35 minutes  Author: Max Sane 12/16/2021 9:48 PM  For on call review [email or web address].

## 2021-12-17 ENCOUNTER — Inpatient Hospital Stay: Payer: Medicare Other

## 2021-12-17 DIAGNOSIS — S32592A Other specified fracture of left pubis, initial encounter for closed fracture: Secondary | ICD-10-CM | POA: Diagnosis not present

## 2021-12-17 DIAGNOSIS — I10 Essential (primary) hypertension: Secondary | ICD-10-CM | POA: Diagnosis not present

## 2021-12-17 DIAGNOSIS — S72002A Fracture of unspecified part of neck of left femur, initial encounter for closed fracture: Secondary | ICD-10-CM | POA: Diagnosis not present

## 2021-12-17 DIAGNOSIS — D62 Acute posthemorrhagic anemia: Secondary | ICD-10-CM | POA: Diagnosis not present

## 2021-12-17 LAB — CBC
HCT: 22.3 % — ABNORMAL LOW (ref 36.0–46.0)
HCT: 21.4 % — ABNORMAL LOW (ref 36.0–46.0)
Hemoglobin: 7.6 g/dL — ABNORMAL LOW (ref 12.0–15.0)
Hemoglobin: 7.2 g/dL — ABNORMAL LOW (ref 12.0–15.0)
MCH: 29.7 pg (ref 26.0–34.0)
MCH: 29.5 pg (ref 26.0–34.0)
MCHC: 34.1 g/dL (ref 30.0–36.0)
MCHC: 33.6 g/dL (ref 30.0–36.0)
MCV: 87.1 fL (ref 80.0–100.0)
MCV: 87.7 fL (ref 80.0–100.0)
Platelets: 107 10*3/uL — ABNORMAL LOW (ref 150–400)
Platelets: 118 10*3/uL — ABNORMAL LOW (ref 150–400)
RBC: 2.56 MIL/uL — ABNORMAL LOW (ref 3.87–5.11)
RBC: 2.44 MIL/uL — ABNORMAL LOW (ref 3.87–5.11)
RDW: 14.6 % (ref 11.5–15.5)
RDW: 14.6 % (ref 11.5–15.5)
WBC: 11 10*3/uL — ABNORMAL HIGH (ref 4.0–10.5)
WBC: 11.6 10*3/uL — ABNORMAL HIGH (ref 4.0–10.5)
nRBC: 0.4 % — ABNORMAL HIGH (ref 0.0–0.2)
nRBC: 0.3 % — ABNORMAL HIGH (ref 0.0–0.2)

## 2021-12-17 LAB — BASIC METABOLIC PANEL
Anion gap: 6 (ref 5–15)
BUN: 20 mg/dL (ref 8–23)
CO2: 27 mmol/L (ref 22–32)
Calcium: 8 mg/dL — ABNORMAL LOW (ref 8.9–10.3)
Chloride: 107 mmol/L (ref 98–111)
Creatinine, Ser: 0.63 mg/dL (ref 0.44–1.00)
GFR, Estimated: >60 mL/min (ref >60)
Glucose, Bld: 125 mg/dL — ABNORMAL HIGH (ref 70–99)
Potassium: 4.5 mmol/L (ref 3.5–5.1)
Sodium: 140 mmol/L (ref 135–145)

## 2021-12-17 LAB — HEMOGLOBIN AND HEMATOCRIT, BLOOD
HCT: 23.5 % — ABNORMAL LOW (ref 36.0–46.0)
Hemoglobin: 8 g/dL — ABNORMAL LOW (ref 12.0–15.0)

## 2021-12-17 LAB — PREPARE RBC (CROSSMATCH)

## 2021-12-17 MED ORDER — PANTOPRAZOLE SODIUM 40 MG PO TBEC
40.0000 mg | DELAYED_RELEASE_TABLET | Freq: Two times a day (BID) | ORAL | Status: DC
  Administered 2021-12-18 – 2021-12-21 (×6): 40 mg via ORAL
  Filled 2021-12-17 (×8): qty 1

## 2021-12-17 MED ORDER — HYDROCODONE-ACETAMINOPHEN 5-325 MG PO TABS
1.0000 | ORAL_TABLET | Freq: Four times a day (QID) | ORAL | Status: DC | PRN
  Administered 2021-12-17 – 2021-12-18 (×2): 1 via ORAL
  Filled 2021-12-17 (×2): qty 1

## 2021-12-17 MED ORDER — SODIUM CHLORIDE 0.9% IV SOLUTION: Freq: Once | INTRAVENOUS | Status: DC

## 2021-12-17 NOTE — Progress Notes (Signed)
°  Subjective: 3 Days Post-Op Procedure(s) (LRB): TOTAL HIP ARTHROPLASTY ANTERIOR APPROACH (Left) Patient reports pain as well-controlled.   Patient is well, and has had no acute complaints or problems RN reports patient removed Prevena overnight  Objective: Vital signs in last 24 hours: Temp:  [98 F (36.7 C)-99.8 F (37.7 C)] 99.8 F (37.7 C) (12/25 0819) Pulse Rate:  [92-104] 92 (12/25 0819) Resp:  [16-20] 16 (12/25 0819) BP: (122-144)/(51-75) 144/59 (12/25 0819) SpO2:  [93 %-100 %] 95 % (12/25 0819)  Intake/Output from previous day:  Intake/Output Summary (Last 24 hours) at 12/17/2021 1024 Last data filed at 12/17/2021 0600 Gross per 24 hour  Intake 542 ml  Output 1050 ml  Net -508 ml    Intake/Output this shift: No intake/output data recorded.  Labs: Recent Labs    12/14/21 2002 12/15/21 0434 12/16/21 0509 12/16/21 2008 12/17/21 0539  HGB 10.2* 9.1* 7.0* 8.5* 7.6*   Recent Labs    12/16/21 0509 12/16/21 2008 12/17/21 0539  WBC 12.6*  --  11.0*  RBC 2.40*  --  2.56*  HCT 21.2* 24.8* 22.3*  PLT 120*  --  107*   Recent Labs    12/16/21 0509 12/17/21 0539  NA 137 140  K 4.0 4.5  CL 106 107  CO2 27 27  BUN 22 20  CREATININE 0.53 0.63  GLUCOSE 148* 125*  CALCIUM 8.1* 8.0*   No results for input(s): LABPT, INR in the last 72 hours.   EXAM General - Patient is Alert and Appropriate Extremity - Neurovascular intact Dorsiflexion/Plantar flexion intact Compartment soft,  moderate edema noted over L thigh  Dressing/Incision -gauze dressing in place, no drainage noted  Motor Function - intact, moving foot and toes well on exam.    Assessment/Plan: 3 Days Post-Op Procedure(s) (LRB): TOTAL HIP ARTHROPLASTY ANTERIOR APPROACH (Left) Principal Problem:   Left displaced femoral neck fracture (HCC) Active Problems:   Fracture of multiple pubic rami, left, closed, initial encounter (HCC)   Mixed Alzheimer's and vascular dementia ([place])   Fall at home,  initial encounter   Recurrent falls   MGUS (monoclonal gammopathy of unknown significance)   Closed left hip fracture (HCC)   Hypothyroidism   Benign essential HTN   Acute blood loss as cause of postoperative anemia  Estimated body mass index is 23.63 kg/m as calculated from the following:   Height as of this encounter: 5\' 9"  (1.753 m).   Weight as of this encounter: 72.6 kg. Advance diet Up with therapy  Labs reviewed  Post-op anemia Hg 8.5-->7.6 today Lovenox held at this time per medicine.     DVT Prophylaxis - Ted hose, SCDs Weight-Bearing as tolerated to left leg  Cassell Smiles, PA-C [facility] Orthopaedic Surgery 12/17/2021, 10:24 AM

## 2021-12-17 NOTE — Progress Notes (Signed)
Patient removed wound vac dressing this morning. Gauze and tegaderm placed on the incision until seen by ortho team. Dr Tamala Julian and day shift nurse notified.

## 2021-12-17 NOTE — Progress Notes (Signed)
°  Progress Note   Patient: Allison Ramos TJQ:[id number] DOB: March 03, 1941 DOA: 12/13/2021     3 DOS: the patient was seen and examined on 12/17/2021   Brief hospital course: [name] is a 80 y.o. female with medical history significant for Dementia, MGUS, HLD and osteoarthritis with history of recent falls who presented to [facility] ED following a fall.  Patient is unable to contribute to history due to dementia.  Work-up revealed left femoral neck fracture, fracture of superior and inferior pubic rami.  Post left femoral neck fracture repair on 12/14/2021 by orthopedic surgery Dr. [name].   12/15/2021: no acute events overnight.  She is alert and minimally interactive.  She denies having any pain. 12/16/21 - Hb 7 requiring 1 PRBC transfusion  12/25: Hemoglobin 7.6 today.  Patient denies any obvious GI bleed, holding Lovenox  Assessment and Plan * Left displaced femoral neck fracture ([place])- (present on admission) S/p fall. Underwent THA on 12/22. Pain is controlled. Waiting for SNF placement.  Holding Lovenox due to hemoglobin trending down.  SCDs for now  Acute blood loss as cause of postoperative anemia Hemoglobin trending down Hb 13->7> 8.5>7.6 status post 1 PRBC transfusion yesterday  Monitor for any s/s of Bleed. No obvious GI bleed. May consider GI c/s if further drop in Hb.  Hold Lovenox and place SCDs, avoid NSAIDs.  CT abdomen/pelvis to rule out retroperitoneal bleed, stool Hemoccult  Benign essential HTN- (present on admission) continue Toprol-XL.  Hypothyroidism- (present on admission) Continue synthroid.  MGUS (monoclonal gammopathy of unknown significance)- (present on admission) Follows with onco.  Recurrent falls Multifactorial. PT, OT recommends SNF and TOC working on placement.  Mixed Alzheimer's and vascular dementia ([place])- (present on admission) Continue Namenda. Overall poor prognosis.  Palliative care consultation   Subjective: Patient reports some pain  at surgical site while trying to get up for bedside commode  Objective Vital signs were reviewed and unremarkable.  80 y.o. year-old female well developed well nourished in no acute distress.  Alert and awake Cardiovascular: Regular rate and rhythm with no rubs or gallops.  No thyromegaly or JVD noted.  Respiratory: Clear to auscultation with no wheezes or rales. Good inspiratory effort. Abdomen: Soft, benign Musculoskeletal: Incision site showing no signs of infection.  Gauze dressing in place.  No obvious bleed Skin: No ulcerative lesions noted or rashes, Psychiatry: Mood is normal, more awake today  Data Reviewed: Hb 7.6  Family Communication: Updated Daughter Webb Silversmith over phone  Disposition: Status is: Inpatient  Remains inpatient appropriate because: Hemoglobin trending down.  Getting CT abdomen pelvis to rule out retroperitoneal bleed     Time spent: 35 minutes  Author: Max Sane 12/17/2021 2:27 PM  For on call review [email or web address].

## 2021-12-17 NOTE — Plan of Care (Signed)

## 2021-12-17 NOTE — Plan of Care (Signed)
Patient sleeping between care. Oriented to self only. PRN Norco given overnight. No new changes in assessment. Bed alarm on.  PLAN OF CARE ONGOING Problem: Education: Goal: Verbalization of understanding the information provided (i.e., activity precautions, restrictions, etc) will improve Outcome: Progressing Goal: Individualized Educational Video(s) Outcome: Progressing   Problem: Activity: Goal: Ability to ambulate and perform ADLs will improve Outcome: Progressing   Problem: Clinical Measurements: Goal: Postoperative complications will be avoided or minimized Outcome: Progressing   Problem: Self-Concept: Goal: Ability to maintain and perform role responsibilities to the fullest extent possible will improve Outcome: Progressing   Problem: Pain Management: Goal: Pain level will decrease Outcome: Progressing   Problem: Education: Goal: Knowledge of General Education information will improve Description: Including pain rating scale, medication(s)/side effects and non-pharmacologic comfort measures Outcome: Progressing   Problem: Health Behavior/Discharge Planning: Goal: Ability to manage health-related needs will improve Outcome: Progressing   Problem: Clinical Measurements: Goal: Ability to maintain clinical measurements within normal limits will improve Outcome: Progressing Goal: Will remain free from infection Outcome: Progressing Goal: Diagnostic test results will improve Outcome: Progressing Goal: Respiratory complications will improve Outcome: Progressing Goal: Cardiovascular complication will be avoided Outcome: Progressing   Problem: Activity: Goal: Risk for activity intolerance will decrease Outcome: Progressing   Problem: Nutrition: Goal: Adequate nutrition will be maintained Outcome: Progressing   Problem: Coping: Goal: Level of anxiety will decrease Outcome: Progressing   Problem: Elimination: Goal: Will not experience complications related to  bowel motility Outcome: Progressing Goal: Will not experience complications related to urinary retention Outcome: Progressing   Problem: Pain Managment: Goal: General experience of comfort will improve Outcome: Progressing   Problem: Safety: Goal: Ability to remain free from injury will improve Outcome: Progressing   Problem: Skin Integrity: Goal: Risk for impaired skin integrity will decrease Outcome: Progressing

## 2021-12-18 DIAGNOSIS — D62 Acute posthemorrhagic anemia: Secondary | ICD-10-CM | POA: Diagnosis not present

## 2021-12-18 DIAGNOSIS — I10 Essential (primary) hypertension: Secondary | ICD-10-CM | POA: Diagnosis not present

## 2021-12-18 DIAGNOSIS — S72002A Fracture of unspecified part of neck of left femur, initial encounter for closed fracture: Secondary | ICD-10-CM | POA: Diagnosis not present

## 2021-12-18 DIAGNOSIS — G9341 Metabolic encephalopathy: Secondary | ICD-10-CM | POA: Diagnosis present

## 2021-12-18 LAB — CBC
HCT: 25.6 % — ABNORMAL LOW (ref 36.0–46.0)
Hemoglobin: 8.8 g/dL — ABNORMAL LOW (ref 12.0–15.0)
MCH: 29.6 pg (ref 26.0–34.0)
MCHC: 34.4 g/dL (ref 30.0–36.0)
MCV: 86.2 fL (ref 80.0–100.0)
Platelets: 126 10*3/uL — ABNORMAL LOW (ref 150–400)
RBC: 2.97 MIL/uL — ABNORMAL LOW (ref 3.87–5.11)
RDW: 14.2 % (ref 11.5–15.5)
WBC: 8.1 10*3/uL (ref 4.0–10.5)
nRBC: 0.4 % — ABNORMAL HIGH (ref 0.0–0.2)

## 2021-12-18 MED ORDER — HALOPERIDOL LACTATE 5 MG/ML IJ SOLN
1.0000 mg | Freq: Four times a day (QID) | INTRAMUSCULAR | Status: DC | PRN
  Administered 2021-12-18: 10:00:00 1 mg via INTRAVENOUS
  Filled 2021-12-18: qty 1

## 2021-12-18 MED ORDER — RISPERIDONE 0.5 MG PO TABS
0.5000 mg | ORAL_TABLET | Freq: Two times a day (BID) | ORAL | Status: DC
  Administered 2021-12-18 – 2021-12-19 (×3): 0.5 mg via ORAL
  Filled 2021-12-18 (×3): qty 1

## 2021-12-18 MED ORDER — FLEET ENEMA 7-19 GM/118ML RE ENEM: 1.0000 | ENEMA | Freq: Once | RECTAL | Status: DC | PRN

## 2021-12-18 MED ORDER — SENNOSIDES-DOCUSATE SODIUM 8.6-50 MG PO TABS
2.0000 | ORAL_TABLET | Freq: Two times a day (BID) | ORAL | Status: DC
  Administered 2021-12-18 – 2021-12-21 (×6): 2 via ORAL
  Filled 2021-12-18 (×7): qty 2

## 2021-12-18 MED ORDER — CELECOXIB 100 MG PO CAPS
100.0000 mg | ORAL_CAPSULE | Freq: Two times a day (BID) | ORAL | Status: DC
  Administered 2021-12-18 – 2021-12-21 (×5): 100 mg via ORAL
  Filled 2021-12-18 (×8): qty 1

## 2021-12-18 NOTE — Assessment & Plan Note (Addendum)
With behavioral disturbances.  Daughter is trying to find memory care unit. TOC aware

## 2021-12-18 NOTE — Progress Notes (Signed)
Physical Therapy Treatment Patient Details Name: Allison Ramos MRN: [id number] DOB: 1941/01/13 Today's Date: 12/18/2021   History of Present Illness Pt is a 80 y/o F with PMH: alzheimers, HLD, and OA who presented to ED after fall w/ c/o L hip pain. Found to have displaced L femoral neck fx, now s/p L THA (anterior approach) on 12/22 [name]).    PT Comments    Pt received supine in bed, agreeable to therapy. Pt daughter, [name], on phone. PT spoke to Ann at beginning of session - she states she has remained on the line all morning and her mother will pick up the phone and talk to her intermittently in an attempt to keep pt calm. Chart review states pt was combative this morning and pulled out her lines; RN states pt has received Haldol. Upon arrival, pt appears pleasant. She was able to follow some commands. She was distracted by "bugs" in her room. She did attempt to participate with bed mobility this session. Supine<>sit can now be performed with assist of 1 person. She will require +2 for OOB mobility. Pt unable to remain balanced sitting EOB. STS has not been attempted for safety. Pt needing to have BM at ned of session; CNA notified. Would benefit from skilled PT to address above deficits and promote optimal return to PLOF.     Recommendations for follow up therapy are one component of a multi-disciplinary discharge planning process, led by the attending physician.  Recommendations may be updated based on patient status, additional functional criteria and insurance authorization.  Follow Up Recommendations  Skilled nursing-short term rehab (<3 hours/day)     Assistance Recommended at Discharge Frequent or constant Supervision/Assistance  Equipment Recommendations  None recommended by PT    Recommendations for Other Services       Precautions / Restrictions Precautions Precautions: Fall;Anterior Hip Restrictions Weight Bearing Restrictions: Yes LLE Weight Bearing: Weight bearing  as tolerated     Mobility  Bed Mobility Overal bed mobility: Needs Assistance Bed Mobility: Supine to Sit;Sit to Supine     Supine to sit: Max assist;HOB elevated Sit to supine: HOB elevated;Total assist   General bed mobility comments: Pt managed RLE, PT managed LLE and partial trunk. Pt did attempt to lift trunk to sitting.    Transfers                   General transfer comment: deferred, unsafe    Ambulation/Gait                   Stairs             Wheelchair Mobility    Modified Rankin (Stroke Patients Only)       Balance Overall balance assessment: Needs assistance   Sitting balance-Leahy Scale: Poor Sitting balance - Comments: Intermittant CGA-MAX A to remain sitting EOB. Posterior lean with PT cueing to lean forward - minimal feedback by patient. Pt remained EOB for 8 minutes; decreased balance with fatigue. Postural control: Posterior lean     Standing balance comment: deferred, unsafe                            Cognition Arousal/Alertness: Awake/alert Behavior During Therapy: Restless Overall Cognitive Status: History of cognitive impairments - at baseline  General Comments: Pt much more talkative during session. Pleasent (Haldol given this morning) and agreeable. Follows some single step commands. Recognizes her husband when he enters the room. Remains unoriented.        Exercises      General Comments        Pertinent Vitals/Pain Pain Assessment: Faces Faces Pain Scale: Hurts even more Pain Location: L hip w/ mobilization Pain Descriptors / Indicators: Grimacing;Guarding;Aching Pain Intervention(s): Limited activity within patient's tolerance;Monitored during session;Premedicated before session    Home Living                          Prior Function            PT Goals (current goals can now be found in the care plan section) Acute Rehab PT  Goals Patient Stated Goal: to increase mobility while here in the hospital - [name] (daughter) PT Goal Formulation: With family Time For Goal Achievement: 12/29/21 Potential to Achieve Goals: Fair    Frequency    7X/week      PT Plan      Co-evaluation              AM-PAC PT "6 Clicks" Mobility   Outcome Measure  Help needed turning from your back to your side while in a flat bed without using bedrails?: Total Help needed moving from lying on your back to sitting on the side of a flat bed without using bedrails?: A Lot Help needed moving to and from a bed to a chair (including a wheelchair)?: Total Help needed standing up from a chair using your arms (e.g., wheelchair or bedside chair)?: Total Help needed to walk in hospital room?: Total Help needed climbing 3-5 steps with a railing? : Total 6 Click Score: 7    End of Session   Activity Tolerance: Other (comment);Patient tolerated treatment well Patient left: in bed;with call bell/phone within reach;with bed alarm set;with family/visitor present;with nursing/sitter in room Nurse Communication: Mobility status PT Visit Diagnosis: Muscle weakness (generalized) (M62.81);Adult, failure to thrive (R62.7)     Time: 7867-5449 PT Time Calculation (min) (ACUTE ONLY): 25 min  Charges:  $Therapeutic Activity: 8-22 mins $Neuromuscular Re-education: 8-22 mins                     Patrina Levering PT, DPT 12/18/21 1:55 PM [phone]

## 2021-12-18 NOTE — Plan of Care (Signed)
Patient combative towards staff this morning. Patient removed her PIV and tele. RN was able to reorient patient. Pt reports L hip pain, norco given. Honeycomb dressing intact. Bed alarm on.  PLAN OF CARE ONGOING Problem: Education: Goal: Verbalization of understanding the information provided (i.e., activity precautions, restrictions, etc) will improve Outcome: Progressing Goal: Individualized Educational Video(s) Outcome: Progressing   Problem: Activity: Goal: Ability to ambulate and perform ADLs will improve Outcome: Progressing   Problem: Clinical Measurements: Goal: Postoperative complications will be avoided or minimized Outcome: Progressing   Problem: Self-Concept: Goal: Ability to maintain and perform role responsibilities to the fullest extent possible will improve Outcome: Progressing   Problem: Pain Management: Goal: Pain level will decrease Outcome: Progressing   Problem: Education: Goal: Knowledge of General Education information will improve Description: Including pain rating scale, medication(s)/side effects and non-pharmacologic comfort measures Outcome: Progressing   Problem: Clinical Measurements: Goal: Ability to maintain clinical measurements within normal limits will improve Outcome: Progressing Goal: Will remain free from infection Outcome: Progressing Goal: Diagnostic test results will improve Outcome: Progressing Goal: Respiratory complications will improve Outcome: Progressing Goal: Cardiovascular complication will be avoided Outcome: Progressing   Problem: Activity: Goal: Risk for activity intolerance will decrease Outcome: Progressing   Problem: Nutrition: Goal: Adequate nutrition will be maintained Outcome: Progressing   Problem: Elimination: Goal: Will not experience complications related to bowel motility Outcome: Progressing Goal: Will not experience complications related to urinary retention Outcome: Progressing   Problem: Pain  Managment: Goal: General experience of comfort will improve Outcome: Progressing   Problem: Skin Integrity: Goal: Risk for impaired skin integrity will decrease Outcome: Progressing   Problem: Health Behavior/Discharge Planning: Goal: Ability to manage health-related needs will improve 12/18/2021 0641 by Marke Goodwyn, Crist Fat, RN Outcome: Not Progressing 12/18/2021 0641 by Ronnica Dreese, Crist Fat, RN Outcome: Not Progressing   Problem: Coping: Goal: Level of anxiety will decrease Outcome: Not Progressing   Problem: Safety: Goal: Ability to remain free from injury will improve 12/18/2021 0641 by Earon Rivest, Crist Fat, RN Outcome: Not Progressing 12/18/2021 0641 by Vicktoria Muckey, Crist Fat, RN Outcome: Not Progressing

## 2021-12-18 NOTE — Assessment & Plan Note (Addendum)
Dementia with behavioral disturbances.  This is worsened by likely hospital induced delirium.  Multifactorial.  Avoid benzos and narcotics.  Discussed with daughter and she would like to avoid any psychotropic medications.  I will stop as needed Haldol and low-dose Risperdal which were started yesterday due to her severe agitation.  Daughter would like to be notified before starting any new medications.

## 2021-12-18 NOTE — Progress Notes (Signed)
°  Progress Note   Patient: Allison Ramos OIN:[id number] DOB: 07-10-1941 DOA: 12/13/2021     4 DOS: the patient was seen and examined on 12/18/2021   Brief hospital course: Allison Ramos is a 80 y.o. female with medical history significant for Dementia, MGUS, HLD and osteoarthritis with history of recent falls who presented to [facility] ED following a fall.  Patient is unable to contribute to history due to dementia.  Work-up revealed left femoral neck fracture, fracture of superior and inferior pubic rami.  Post left femoral neck fracture repair on 12/14/2021 by orthopedic surgery Dr. [name].   12/15/2021: no acute events overnight.  She is alert and minimally interactive.  She denies having any pain. 12/16/21 - Hb 7 requiring 1 PRBC transfusion  12/25: Hemoglobin 7.6 today.  Patient denies any obvious GI bleed, holding Lovenox 12/26: Patient is combative and agitated since last night  Assessment and Plan * Left displaced femoral neck fracture ([place])- (present on admission) S/p fall. Underwent THA on 12/22. Pain is controlled. Waiting for SNF placement.  Holding Lovenox due to hemoglobin trending down.  SCDs for now.  Acute metabolic encephalopathy- (present on admission) Dementia with behavioral disturbances.  This is worsened by likely hospital induced delirium.  Multifactorial.  Avoid benzos and narcotics.  Discussed with daughter  Acute blood loss as cause of postoperative anemia Hemoglobin trending down Hb 13->7> 8.5>7.6> 8.8 status post 2 PRBC transfusion this admission  Monitor for any s/s of Bleed. No obvious GI bleed. May consider GI c/s if further drop in Hb.  Hold Lovenox and place SCDs, avoid NSAIDs.  CT abdomen/pelvis shows stool burden and left hip Subcutaneous and intramuscular complex fluid, likely hemorrhage..  Discussed with Ortho who does not feel any surgical intervention needed  Benign essential HTN- (present on admission) continue Toprol-XL  Hypothyroidism- (present  on admission) Continue synthroid.  MGUS (monoclonal gammopathy of unknown significance)- (present on admission) Follows with onco  Recurrent falls Multifactorial. PT, OT recommends SNF and TOC working on placement  Mixed Alzheimer's and vascular dementia ([place])- (present on admission) Continue Namenda. Overall poor prognosis.  Palliative care consultation pending  Dementia [facility])- (present on admission) With behavioral disturbances.  Daughter is trying to find memory care unit.  Agreeable with palliative care consultation   Subjective: Very confused and agitated since last night.  Pulling out lines, telemetry wires and trying to get out of bed  Objective Vital signs were reviewed and unremarkable.  80 y.o. year-old female well developed well nourished -quite agitated today Cardiovascular: Regular rate and rhythm with no rubs or gallops.  No thyromegaly or JVD noted.  Respiratory: Clear to auscultation with no wheezes or rales. Good inspiratory effort. Abdomen: Soft, benign Musculoskeletal: Incision site showing no signs of infection.  Gauze dressing in place.  No obvious bleed Skin: No ulcerative lesions noted or rashes, Psychiatry: Agitated and combative, confused.  Hallucinating  Data Reviewed: Hemoglobin 8.8  Family Communication: Updated daughter Allison Ramos over phone  Disposition: Status is: Inpatient  Remains inpatient appropriate because: Confused and agitated.  Waiting for SNF placement   DVT prophylaxis-SCDs  Time spent: 25 minutes  Author: Max Sane 12/18/2021 1:31 PM  For on call review [email or web address].

## 2021-12-18 NOTE — Care Management Important Message (Signed)
Important Message  Patient Details  Name: Allison Ramos MRN: [id number] Date of Birth: August 26, 1941   Medicare Important Message Given:  Yes     [name] 12/18/2021, 3:44 PM

## 2021-12-18 NOTE — Progress Notes (Signed)
°  Subjective: 4 Days Post-Op Procedure(s) (LRB): TOTAL HIP ARTHROPLASTY ANTERIOR APPROACH (Left) Patient reports pain as well-controlled.   Patient with husband at bedside.   Objective: Vital signs in last 24 hours: Temp:  [97.8 F (36.6 C)-99.5 F (37.5 C)] 97.8 F (36.6 C) (12/26 0806) Pulse Rate:  [73-100] 73 (12/26 0806) Resp:  [15-20] 15 (12/26 0806) BP: (114-167)/(48-76) 147/53 (12/26 0806) SpO2:  [91 %-97 %] 97 % (12/26 0806)  Intake/Output from previous day:  Intake/Output Summary (Last 24 hours) at 12/18/2021 1309 Last data filed at 12/17/2021 2300 Gross per 24 hour  Intake 340 ml  Output 100 ml  Net 240 ml    Intake/Output this shift: No intake/output data recorded.  Labs: Recent Labs    12/16/21 2008 12/17/21 0539 12/17/21 1442 12/17/21 2329 12/18/21 1030  HGB 8.5* 7.6* 7.2* 8.0* 8.8*   Recent Labs    12/17/21 1442 12/17/21 2329 12/18/21 1030  WBC 11.6*  --  8.1  RBC 2.44*  --  2.97*  HCT 21.4* 23.5* 25.6*  PLT 118*  --  126*   Recent Labs    12/16/21 0509 12/17/21 0539  NA 137 140  K 4.0 4.5  CL 106 107  CO2 27 27  BUN 22 20  CREATININE 0.53 0.63  GLUCOSE 148* 125*  CALCIUM 8.1* 8.0*   No results for input(s): LABPT, INR in the last 72 hours.   EXAM General - Patient is  alert to person  only  Extremity - Neurovascular intact Dorsiflexion/Plantar flexion intact Compartment soft Dressing/Incision -mild serous drainage noted  Motor Function - intact, moving foot and toes well on exam.    Assessment/Plan: 4 Days Post-Op Procedure(s) (LRB): TOTAL HIP ARTHROPLASTY ANTERIOR APPROACH (Left) Principal Problem:   Left displaced femoral neck fracture (HCC) Active Problems:   Fracture of multiple pubic rami, left, closed, initial encounter (HCC)   Mixed Alzheimer's and vascular dementia ([place])   Fall at home, initial encounter   Recurrent falls   MGUS (monoclonal gammopathy of unknown significance)   Closed left hip fracture (HCC)    Hypothyroidism   Benign essential HTN   Acute blood loss as cause of postoperative anemia  Estimated body mass index is 23.63 kg/m as calculated from the following:   Height as of this encounter: 5\' 9"  (1.753 m).   Weight as of this encounter: 72.6 kg. Advance diet Up with therapy       DVT Prophylaxis - TED hose, SCDs Weight-Bearing as tolerated to left leg  Cassell Smiles, PA-C [facility] Orthopaedic Surgery 12/18/2021, 1:09 PM

## 2021-12-18 NOTE — TOC Progression Note (Signed)
Transition of Care [facility]) - Progression Note    Patient Details  Name: Allison Ramos MRN: [id number] Date of Birth: 05-10-1941  Transition of Care [facility]) CM/SW [place], RN Phone Number: 12/18/2021, 9:16 AM  Clinical Narrative:    No bed offers at this time, resent the bed search in the Hub, will review bed offers once obtained        Expected Discharge Plan and Services                                                 Social Determinants of Health (SDOH) Interventions    Readmission Risk Interventions No flowsheet data found.

## 2021-12-19 ENCOUNTER — Encounter: Payer: Self-pay | Admitting: Orthopedic Surgery

## 2021-12-19 DIAGNOSIS — D62 Acute posthemorrhagic anemia: Secondary | ICD-10-CM | POA: Diagnosis not present

## 2021-12-19 DIAGNOSIS — G9341 Metabolic encephalopathy: Secondary | ICD-10-CM | POA: Diagnosis not present

## 2021-12-19 DIAGNOSIS — S72002A Fracture of unspecified part of neck of left femur, initial encounter for closed fracture: Secondary | ICD-10-CM | POA: Diagnosis not present

## 2021-12-19 DIAGNOSIS — I10 Essential (primary) hypertension: Secondary | ICD-10-CM | POA: Diagnosis not present

## 2021-12-19 LAB — BASIC METABOLIC PANEL
Anion gap: 6 (ref 5–15)
BUN: 11 mg/dL (ref 8–23)
CO2: 29 mmol/L (ref 22–32)
Calcium: 8 mg/dL — ABNORMAL LOW (ref 8.9–10.3)
Chloride: 103 mmol/L (ref 98–111)
Creatinine, Ser: 0.48 mg/dL (ref 0.44–1.00)
GFR, Estimated: >60 mL/min (ref >60)
Glucose, Bld: 106 mg/dL — ABNORMAL HIGH (ref 70–99)
Potassium: 3.6 mmol/L (ref 3.5–5.1)
Sodium: 138 mmol/L (ref 135–145)

## 2021-12-19 LAB — CBC
HCT: 23.7 % — ABNORMAL LOW (ref 36.0–46.0)
HCT: 26.8 % — ABNORMAL LOW (ref 36.0–46.0)
Hemoglobin: 7.8 g/dL — ABNORMAL LOW (ref 12.0–15.0)
Hemoglobin: 9 g/dL — ABNORMAL LOW (ref 12.0–15.0)
MCH: 28.5 pg (ref 26.0–34.0)
MCH: 29.8 pg (ref 26.0–34.0)
MCHC: 32.9 g/dL (ref 30.0–36.0)
MCHC: 33.6 g/dL (ref 30.0–36.0)
MCV: 86.5 fL (ref 80.0–100.0)
MCV: 88.7 fL (ref 80.0–100.0)
Platelets: 140 10*3/uL — ABNORMAL LOW (ref 150–400)
Platelets: 161 10*3/uL (ref 150–400)
RBC: 2.74 MIL/uL — ABNORMAL LOW (ref 3.87–5.11)
RBC: 3.02 MIL/uL — ABNORMAL LOW (ref 3.87–5.11)
RDW: 14.1 % (ref 11.5–15.5)
RDW: 14.2 % (ref 11.5–15.5)
WBC: 7.2 10*3/uL (ref 4.0–10.5)
WBC: 7.7 10*3/uL (ref 4.0–10.5)
nRBC: 0.4 % — ABNORMAL HIGH (ref 0.0–0.2)
nRBC: 0.4 % — ABNORMAL HIGH (ref 0.0–0.2)

## 2021-12-19 LAB — SURGICAL PATHOLOGY

## 2021-12-19 MED ORDER — FE FUMARATE-B12-VIT C-FA-IFC PO CAPS
1.0000 | ORAL_CAPSULE | Freq: Two times a day (BID) | ORAL | Status: DC
  Administered 2021-12-19 – 2021-12-21 (×4): 1 via ORAL
  Filled 2021-12-19 (×5): qty 1

## 2021-12-19 NOTE — Progress Notes (Signed)
Progress Note   Patient: Allison Ramos XIP:[id number] DOB: 11/07/1941 DOA: 12/13/2021     5 DOS: the patient was seen and examined on 12/19/2021   Brief hospital course: Allison Ramos is a 80 y.o. female with medical history significant for Dementia, MGUS, HLD and osteoarthritis with history of recent falls who presented to [facility] ED following a fall.  Patient is unable to contribute to history due to dementia.  Work-up revealed left femoral neck fracture, fracture of superior and inferior pubic rami.  Post left femoral neck fracture repair on 12/14/2021 by orthopedic surgery Dr. [name].   12/15/2021: no acute events overnight.  She is alert and minimally interactive.  She denies having any pain. 12/16/21 - Hb 7 requiring 1 PRBC transfusion  12/25: Hemoglobin 7.6 today.  Patient denies any obvious GI bleed, holding Lovenox 12/26: Patient is combative and agitated since last night 12/27 : Stopped Risperdal and Haldol per daughter's request.  Daughter would like to get call for any new medication before starting  Assessment and Plan * Left displaced femoral neck fracture ([place])- (present on admission) S/p fall. Underwent THA on 12/22. Pain is controlled. Waiting for SNF placement.  Holding Lovenox due to hemoglobin trending down.  SCDs for now  Acute metabolic encephalopathy- (present on admission) Dementia with behavioral disturbances.  This is worsened by likely hospital induced delirium.  Multifactorial.  Avoid benzos and narcotics.  Discussed with daughter and she would like to avoid any psychotropic medications.  I will stop as needed Haldol and low-dose Risperdal which were started yesterday due to her severe agitation.  Daughter would like to be notified before starting any new medications.  Acute blood loss as cause of postoperative anemia Hemoglobin trending down Hb 13->7> 8.5>7.6> 8.8>7.8 status post 2 PRBC transfusion this admission  Monitor for any s/s of Bleed. No obvious GI  bleed. Hold Lovenox and place SCDs, avoid NSAIDs.  CT abdomen/pelvis shows stool burden and left hip Subcutaneous and intramuscular complex fluid, likely hemorrhage..  Discussed with Ortho who does not feel any surgical intervention needed for now. Daughter Webb Silversmith) is requesting Ortho to call her  Benign essential HTN- (present on admission) continue Toprol-XL  Hypothyroidism- (present on admission) Continue synthroid  MGUS (monoclonal gammopathy of unknown significance)- (present on admission) Follows with onco at [facility]  Recurrent falls Multifactorial. PT, OT recommends SNF and TOC working on placement, Daughter interested in memory care unit  Mixed Alzheimer's and vascular dementia ([place])- (present on admission) Continue Namenda. Overall poor prognosis.   Dementia ([place])- (present on admission) With behavioral disturbances.  Daughter is trying to find memory care unit. TOC aware   As per daughter's preference,.  I will stop Haldol as needed and Risperdal for now.  I will request safety sitter for patient safety  Subjective: She is much more alert and awake today.  Also less confused.  Hemoglobin 7.8  Objective Vital signs were reviewed and unremarkable.  80 y.o. year-old female well developed well nourished  Cardiovascular: Regular rate and rhythm with no rubs or gallops.  No thyromegaly or JVD noted.  Respiratory: Clear to auscultation with no wheezes or rales. Good inspiratory effort. Abdomen: Soft, benign Musculoskeletal: Incision site showing no signs of infection.  Gauze dressing in place.  No obvious bleed Skin: No ulcerative lesions noted or rashes Neuro: Nonfocal Psychiatry: Normal mood and affect  Data Reviewed: Hemoglobin 7.8  Family Communication: Updated patient's daughter Webb Silversmith over phone.   Disposition: Status is: Inpatient  Remains inpatient appropriate because: Waiting for  SNF.  Hemoglobin trending down, Ortho aware   DVT prophylaxis-SCDs  Time spent: 25  minutes  Author: Max Sane 12/19/2021 1:14 PM  For on call review [email or web address].

## 2021-12-19 NOTE — Progress Notes (Signed)
°  Subjective: 5 Days Post-Op Procedure(s) (LRB): TOTAL HIP ARTHROPLASTY ANTERIOR APPROACH (Left) Patient reports pain as well-controlled.   Patient confused but pleasant this AM, not combative.   Objective: Vital signs in last 24 hours: Temp:  [98 F (36.7 C)-99.1 F (37.3 C)] 98.1 F (36.7 C) (12/27 0741) Pulse Rate:  [76-90] 86 (12/27 0741) Resp:  [15-16] 15 (12/27 0741) BP: (133-158)/(52-62) 158/62 (12/27 0741) SpO2:  [97 %-100 %] 98 % (12/27 0741)  Intake/Output from previous day:  Intake/Output Summary (Last 24 hours) at 12/19/2021 0810 Last data filed at 12/19/2021 0459 Gross per 24 hour  Intake 240 ml  Output 1100 ml  Net -860 ml    Intake/Output this shift: No intake/output data recorded.  Labs: Recent Labs    12/17/21 0539 12/17/21 1442 12/17/21 2329 12/18/21 1030 12/19/21 0336  HGB 7.6* 7.2* 8.0* 8.8* 7.8*   Recent Labs    12/18/21 1030 12/19/21 0336  WBC 8.1 7.2  RBC 2.97* 2.74*  HCT 25.6* 23.7*  PLT 126* 140*   Recent Labs    12/17/21 0539 12/19/21 0336  NA 140 138  K 4.5 3.6  CL 107 103  CO2 27 29  BUN 20 11  CREATININE 0.63 0.48  GLUCOSE 125* 106*  CALCIUM 8.0* 8.0*   No results for input(s): LABPT, INR in the last 72 hours.   EXAM General - Patient is Alert and confused Extremity - Neurovascular intact Dorsiflexion/Plantar flexion intact Compartment soft Dressing/Incision -[place] in place, mild drainage noted  Motor Function - intact, moving foot and toes well on exam.  Assessment/Plan: 5 Days Post-Op Procedure(s) (LRB): TOTAL HIP ARTHROPLASTY ANTERIOR APPROACH (Left) Principal Problem:   Left displaced femoral neck fracture (HCC) Active Problems:   Fracture of multiple pubic rami, left, closed, initial encounter (HCC)   Dementia (HCC)   Mixed Alzheimer's and vascular dementia ([place])   Fall at home, initial encounter   Recurrent falls   MGUS (monoclonal gammopathy of unknown significance)   Closed left hip fracture  (HCC)   Hypothyroidism   Benign essential HTN   Acute blood loss as cause of postoperative anemia   Acute metabolic encephalopathy  Estimated body mass index is 23.63 kg/m as calculated from the following:   Height as of this encounter: 5\' 9"  (1.753 m).   Weight as of this encounter: 72.6 kg. Advance diet Up with therapy  Discharge per medicine   Labs reviewed, Hg 8.8-->7.8    DVT Prophylaxis - Ted hose and SCDs Weight-Bearing as tolerated to left leg  Cassell Smiles, PA-C [facility] Orthopaedic Surgery 12/19/2021, 8:10 AM

## 2021-12-19 NOTE — Progress Notes (Signed)
Occupational Therapy Treatment Patient Details Name: Allison Ramos MRN: [id number] DOB: 10/05/1941 Today's Date: 12/19/2021   History of present illness Pt is a 80 y/o F with PMH: alzheimers, HLD, and OA who presented to ED after fall w/ c/o L hip pain. Found to have displaced L femoral neck fx, now s/p L THA (anterior approach) on 12/22 [name]).   OT comments  Pt seen for brief OT tx. Pt pleasant, conversational, and denies pain. Pt declines EOB ADL but agreeable to BLE ex. Pt performed heel slides bilat, requiring MIN A for LLE. MIN A for trunk repositioning in bed for improved joint alignment to limit L lateral LOB. Pt left with nurse tech for vitals. Pt demonstrating progress in cognitive and physical limitations however continues to benefit from skilled OT services and continue to recommend SNF for rehab upon discharge.    Recommendations for follow up therapy are one component of a multi-disciplinary discharge planning process, led by the attending physician.  Recommendations may be updated based on patient status, additional functional criteria and insurance authorization.    Follow Up Recommendations  Skilled nursing-short term rehab (<3 hours/day)    Assistance Recommended at Discharge Frequent or constant Supervision/Assistance  Equipment Recommendations  BSC/3in1;Other (comment) (2WW)    Recommendations for Other Services      Precautions / Restrictions Precautions Precautions: Fall;Anterior Hip Restrictions Weight Bearing Restrictions: Yes LLE Weight Bearing: Weight bearing as tolerated       Mobility Bed Mobility Overal bed mobility: Needs Assistance             General bed mobility comments: MIN A for repositioning for optimal trunk and spinal alignment    Transfers                         Balance                                           ADL either performed or assessed with clinical judgement   ADL                                               Extremity/Trunk Assessment              Vision       Perception     Praxis      Cognition Arousal/Alertness: Awake/alert Behavior During Therapy: WFL for tasks assessed/performed Overall Cognitive Status: History of cognitive impairments - at baseline                                 General Comments: Pt pleasant, conversational, able to follow simple commands with cues          Exercises Other Exercises Other Exercises: Pt performed BLE heel slides with MIN A for LLE. No pain per facial expressions   Shoulder Instructions       General Comments      Pertinent Vitals/ Pain       Pain Assessment: Faces Faces Pain Scale: No hurt Pain Intervention(s): Limited activity within patient's tolerance;Monitored during session  Home Living  Prior Functioning/Environment              Frequency  Min 2X/week        Progress Toward Goals  OT Goals(current goals can now be found in the care plan section)  Progress towards OT goals: Progressing toward goals  Acute Rehab OT Goals Patient Stated Goal: none stated Time For Goal Achievement: 12/29/21 Potential to Achieve Goals: [place] Discharge plan remains appropriate;Frequency remains appropriate    Co-evaluation                 AM-PAC OT "6 Clicks" Daily Activity     Outcome Measure   Help from another person eating meals?: A Lot Help from another person taking care of personal grooming?: A Lot Help from another person toileting, which includes using toliet, bedpan, or urinal?: Total Help from another person bathing (including washing, rinsing, drying)?: A Lot Help from another person to put on and taking off regular upper body clothing?: A Lot Help from another person to put on and taking off regular lower body clothing?: A Lot 6 Click Score: 11    End of Session    OT  Visit Diagnosis: Unsteadiness on feet (R26.81);History of falling (Z91.81)   Activity Tolerance Patient tolerated treatment well   Patient Left in bed;with call bell/phone within reach;with bed alarm set   Nurse Communication          Time: 4707-6151 OT Time Calculation (min): 8 min  Charges: OT General Charges $OT Visit: 1 Visit OT Treatments $Therapeutic Exercise: 8-22 mins  Ardeth Perfect., MPH, MS, OTR/L ascom [phone] 12/19/21, 3:59 PM

## 2021-12-19 NOTE — Plan of Care (Signed)

## 2021-12-19 NOTE — Progress Notes (Signed)
Physical Therapy Treatment Patient Details Name: Allison Ramos MRN: [id number] DOB: October 03, 1941 Today's Date: 12/19/2021   History of Present Illness Pt is a 80 y/o F with PMH: alzheimers, HLD, and OA who presented to ED after fall w/ c/o L hip pain. Found to have displaced L femoral neck fx, now s/p L THA (anterior approach) on 12/22 [name]).    PT Comments    Pt received supine in bed, confused and calling out to her husband (not in room). She was agreeable to mobility. Pt progressed to standing this session. Supine>sit remains MAX A; pt does attempt to assist. She responds well to counting down when the LLE will be mobilized as she does not yell out in pain. Once EOB, CNA in room to assist with standing. Pt stood x2 reps from elevated EOB. MAX A +2 to lift and steady. Side stepping attempted, unable to follow instructions and pt sitting down without warning. Pt then returned to bed and placed on a bedpan. Pt mobility status is progressing however remains severely limited. Would benefit from skilled PT to address above deficits and promote optimal return to PLOF.   Recommendations for follow up therapy are one component of a multi-disciplinary discharge planning process, led by the attending physician.  Recommendations may be updated based on patient status, additional functional criteria and insurance authorization.  Follow Up Recommendations  Skilled nursing-short term rehab (<3 hours/day)     Assistance Recommended at Discharge Frequent or constant Supervision/Assistance  Equipment Recommendations  None recommended by PT    Recommendations for Other Services       Precautions / Restrictions Precautions Precautions: Fall;Anterior Hip Restrictions Weight Bearing Restrictions: Yes LLE Weight Bearing: Weight bearing as tolerated     Mobility  Bed Mobility Overal bed mobility: Needs Assistance Bed Mobility: Supine to Sit;Sit to Supine     Supine to sit: HOB elevated;Max  assist Sit to supine: HOB elevated;Total assist   General bed mobility comments: Pt managed RLE, PT managed LLE and partial trunk. Pt did attempt to lift trunk to sitting. Max multimodal cueing to achieve upright.    Transfers Overall transfer level: Needs assistance Equipment used: Rolling walker (2 wheels) Transfers: Sit to/from Stand Sit to Stand: +2 physical assistance;+2 safety/equipment;Max assist           General transfer comment: STS x2 reps from elevated EOB. MAX A +2 to lift and steady. PT also assisting to steady RW. Pt has difficulty following cueing for hand placement.    Ambulation/Gait               General Gait Details: deferred - attempted side step with pt losing balance posteriorly towards bed.   Stairs             Wheelchair Mobility    Modified Rankin (Stroke Patients Only)       Balance Overall balance assessment: Needs assistance Sitting-balance support: Single extremity supported;Feet supported Sitting balance-Leahy Scale: Poor Sitting balance - Comments: CGA-MAX A due to posterior lean/pushing at EOB Postural control: Posterior lean Standing balance support: Bilateral upper extremity supported;Reliant on assistive device for balance;During functional activity Standing balance-Leahy Scale: Poor Standing balance comment: requires assist +2 to remain standing with RW; ~30 seconds                            Cognition Arousal/Alertness: Awake/alert Behavior During Therapy: Restless Overall Cognitive Status: History of cognitive impairments - at baseline  General Comments: Pt pleasant, talkative and agreeable this session. Follows some single step commands; multimodal cueing utilized. Remains unoriented. Pt calling out to husband who is not in room.        Exercises      General Comments        Pertinent Vitals/Pain Pain Assessment: Faces Faces Pain Scale: Hurts even  more Pain Location: L hip Pain Descriptors / Indicators: Grimacing;Guarding;Aching Pain Intervention(s): Limited activity within patient's tolerance;Monitored during session    Home Living                          Prior Function            PT Goals (current goals can now be found in the care plan section) Acute Rehab PT Goals Patient Stated Goal: to increase mobility while here in the hospital - Allison Ramos (daughter) PT Goal Formulation: With family Time For Goal Achievement: 12/29/21 Potential to Achieve Goals: Fair    Frequency    7X/week      PT Plan      Co-evaluation              AM-PAC PT "6 Clicks" Mobility   Outcome Measure  Help needed turning from your back to your side while in a flat bed without using bedrails?: A Lot Help needed moving from lying on your back to sitting on the side of a flat bed without using bedrails?: A Lot Help needed moving to and from a bed to a chair (including a wheelchair)?: Total Help needed standing up from a chair using your arms (e.g., wheelchair or bedside chair)?: A Lot Help needed to walk in hospital room?: Total Help needed climbing 3-5 steps with a railing? : Total 6 Click Score: 9    End of Session Equipment Utilized During Treatment: Gait belt Activity Tolerance: Patient tolerated treatment well Patient left: in bed;with call bell/phone within reach;with bed alarm set;with nursing/sitter in room Nurse Communication: Mobility status;Precautions PT Visit Diagnosis: Muscle weakness (generalized) (M62.81);Adult, failure to thrive (R62.7)     Time: 1751-0258 PT Time Calculation (min) (ACUTE ONLY): 17 min  Charges:  $Therapeutic Activity: 8-22 mins                     Patrina Levering PT, DPT 12/19/21 11:25 AM [phone]

## 2021-12-19 NOTE — TOC Progression Note (Addendum)
Transition of Care [facility]) - Progression Note    Patient Details  Name: Allison Ramos MRN: [id number] Date of Birth: 17-Apr-1941  Transition of Care [facility]) CM/SW [place], RN Phone Number: 12/19/2021, 9:41 AM  Clinical Narrative:   Damaris Schooner with Webb Silversmith the patient's Daughter, we spoke about the patient's dementia and current state, We talked about the options of Memory care, There are no bed offers at this time for STR SNF, she is still confused and having to have Haldol, still has on mitts  I offered to look into Memory care and see what is available for the patient, I will touch base with Webb Silversmith as I get any available options, She brought up concerns withy the patient needing help eating, I let her know I would make sure the charge nurse is aware and that the staff will assist her in eating and not just drop off her tray, She brought up that the patient's other daughter was here and requested the patient have assistance going to the bathroom and no one came, it took the patient's husband coming out and requesting assistance, I explained that I would also bring this to the charge nurse's attention and if the patient needs assistance going to the bathroom that her needs will be met. She asked that if there is a time that the patient is having agitation that they call her before administering medication that she is often able to get her to calm down, I brought this to the Assist director of the unit's attention  [place] request to Memory Care Units in the [place], [place] and left a secure VM for alice at [phone] requesting a memory care unit, awaiting a call back       Expected Discharge Plan and Services                                                 Social Determinants of Health (SDOH) Interventions    Readmission Risk Interventions No flowsheet data found.

## 2021-12-19 NOTE — Plan of Care (Signed)
Consult noted for [place]. Epic chatted with attending MD, per our conversation, will hold off on initiating this consult until notified by primary MD to move forward.

## 2021-12-19 NOTE — TOC Progression Note (Signed)
Transition of Care [facility]) - Progression Note    Patient Details  Name: Allison Ramos MRN: [id number] Date of Birth: [birth year]  Transition of Care [facility]) CM/SW [place], RN Phone Number: 12/19/2021, 9:23 AM  Clinical Narrative:    Emilia Beck the patient's Daughter, unable to reach, left a general Voice Mail  requesting a call back        Expected Discharge Plan and Services                                                 Social Determinants of Health (SDOH) Interventions    Readmission Risk Interventions No flowsheet data found.

## 2021-12-20 DIAGNOSIS — S72002A Fracture of unspecified part of neck of left femur, initial encounter for closed fracture: Secondary | ICD-10-CM | POA: Diagnosis not present

## 2021-12-20 LAB — BASIC METABOLIC PANEL
Anion gap: 6 (ref 5–15)
BUN: 12 mg/dL (ref 8–23)
CO2: 29 mmol/L (ref 22–32)
Calcium: 8.1 mg/dL — ABNORMAL LOW (ref 8.9–10.3)
Chloride: 102 mmol/L (ref 98–111)
Creatinine, Ser: 0.44 mg/dL (ref 0.44–1.00)
GFR, Estimated: >60 mL/min (ref >60)
Glucose, Bld: 110 mg/dL — ABNORMAL HIGH (ref 70–99)
Potassium: 3.6 mmol/L (ref 3.5–5.1)
Sodium: 137 mmol/L (ref 135–145)

## 2021-12-20 LAB — TYPE AND SCREEN
ABO/RH(D): O POS
Antibody Screen: POSITIVE
Donor AG Type: NEGATIVE
Donor AG Type: NEGATIVE
Unit division: 0
Unit division: 0
Unit division: 0
Unit division: 0

## 2021-12-20 LAB — BPAM RBC
Blood Product Expiration Date: 202301022359
Blood Product Expiration Date: 202301142359
Blood Product Expiration Date: 202301182359
Blood Product Expiration Date: 202301182359
ISSUE DATE / TIME: 202212241522
ISSUE DATE / TIME: 202212251824
Unit Type and Rh: 5100
Unit Type and Rh: 5100
Unit Type and Rh: 5100
Unit Type and Rh: 5100

## 2021-12-20 LAB — CBC
HCT: 26.4 % — ABNORMAL LOW (ref 36.0–46.0)
Hemoglobin: 8.8 g/dL — ABNORMAL LOW (ref 12.0–15.0)
MCH: 29.4 pg (ref 26.0–34.0)
MCHC: 33.3 g/dL (ref 30.0–36.0)
MCV: 88.3 fL (ref 80.0–100.0)
Platelets: 194 10*3/uL (ref 150–400)
RBC: 2.99 MIL/uL — ABNORMAL LOW (ref 3.87–5.11)
RDW: 14 % (ref 11.5–15.5)
WBC: 7.5 10*3/uL (ref 4.0–10.5)
nRBC: 0.3 % — ABNORMAL HIGH (ref 0.0–0.2)

## 2021-12-20 LAB — RESP PANEL BY RT-PCR (FLU A&B, COVID) ARPGX2
Influenza A by PCR: NEGATIVE
Influenza B by PCR: NEGATIVE
SARS Coronavirus 2 by RT PCR: NEGATIVE

## 2021-12-20 MED ORDER — ENOXAPARIN SODIUM 40 MG/0.4ML IJ SOSY
40.0000 mg | PREFILLED_SYRINGE | INTRAMUSCULAR | Status: DC
  Administered 2021-12-20: 19:00:00 40 mg via SUBCUTANEOUS
  Filled 2021-12-20: qty 0.4

## 2021-12-20 NOTE — Progress Notes (Signed)
PROGRESS NOTE    Allison Ramos  JTT:[id number] DOB: 08-Mar-1941 DOA: 12/13/2021 PCP: Latanya Maudlin, NP   Brief Narrative:  Allison Ramos is a 80 y.o. female with medical history significant for Dementia, MGUS, HLD and osteoarthritis with history of recent falls who presented to [facility] ED following a fall.  Patient is unable to contribute to history due to dementia.  Work-up revealed left femoral neck fracture, fracture of superior and inferior pubic rami.  Post left femoral neck fracture repair on 12/14/2021 by orthopedic surgery Dr. [name].   12/15/2021: no acute events overnight.  She is alert and minimally interactive.  She denies having any pain. 12/16/21 - Hb 7 requiring 1 PRBC transfusion  12/25: Hemoglobin 7.6 today.  Patient denies any obvious GI bleed, holding Lovenox 12/26: Patient is combative and agitated since last night 12/27 : Stopped Risperdal and Haldol per daughter's request.  Daughter would like to get call for any new medication before starting 12/28 no acute issues or events overnight, mental status appears to be improving per daughter at bedside   Assessment and Plan  Left displaced femoral neck fracture ([place])- (present on admission) S/p fall. Underwent THA on 12/22. Pain is controlled. Waiting for SNF placement.  Holding Lovenox due to hemoglobin trending down.  SCDs for now   Acute metabolic encephalopathy- (present on admission) Dementia with behavioral disturbances.   Worsened by hospital delirium  Discontinue Haldol Risperdal per discussion with daughter  Daughter would like to be notified before starting any new medications.   Acute blood loss as cause of postoperative anemia Hemoglobin stabilizing over the past few days Status post 2 PRBC transfusion this admission CT abdomen/pelvis shows left hip Subcutaneous and intramuscular complex fluid, likely hemorrhage, no surgical intervention recommended per orthopedic surgery   Benign essential HTN-  (present on admission) continue Toprol-XL   Hypothyroidism- (present on admission) Continue synthroid   MGUS (monoclonal gammopathy of unknown significance)- (present on admission) Follows with onco at [facility]   Recurrent falls Multifactorial. PT, OT recommends SNF and TOC working on placement, Daughter interested in memory care unit   Mixed Alzheimer's and vascular dementia ([place])- (present on admission) Continue Namenda. Overall poor prognosis.    Dementia ([place])- (present on admission) With behavioral disturbances.  Daughter is trying to find memory care unit. TOC aware  DVT prophylaxis: SCDs Code Status: Full Family Communication: Daughter at bedside  Status is: Inpatient  Dispo: The patient is from: Home              Anticipated d/c is to: SNF              Anticipated d/c date is: 24 to 48 hours              Patient currently not medically stable for discharge  Consultants:  Orthopedic surgery  Procedures:  Left femur fracture 1222  Antimicrobials:  None  Subjective: No acute issues or events overnight  Objective: Vitals:   12/19/21 1555 12/19/21 1948 12/20/21 0001 12/20/21 0350  BP: (!) 158/52 (!) 152/65 (!) 166/64 (!) 157/68  Pulse: 77 80 86 87  Resp: 15 16 16 17   Temp: 97.6 F (36.4 C) 98.7 F (37.1 C) 98.6 F (37 C) 98 F (36.7 C)  TempSrc:    Oral  SpO2: 97% 97% 97% 96%  Weight:      Height:        Intake/Output Summary (Last 24 hours) at 12/20/2021 0092 Last data filed at 12/20/2021 0000 Gross per 24 hour  Intake --  Output 900 ml  Net -900 ml   Filed Weights   12/14/21 1405  Weight: 72.6 kg    Examination:  General:  Pleasantly resting in bed, No acute distress. HEENT:  Normocephalic atraumatic.  Sclerae nonicteric, noninjected.  Extraocular movements intact bilaterally. Neck:  Without mass or deformity.  Trachea is midline. Lungs:  Clear to auscultate bilaterally without rhonchi, wheeze, or rales. Heart:  Regular rate and rhythm.   Without murmurs, rubs, or gallops. Abdomen:  Soft, nontender, nondistended.  Without guarding or rebound. Extremities: Without cyanosis, clubbing, edema, or obvious deformity. Vascular:  Dorsalis pedis and posterior tibial pulses palpable bilaterally. Skin:  Warm and dry, no erythema, no ulcerations.  Data Reviewed: I have personally reviewed following labs and imaging studies  CBC: Recent Labs  Lab 12/13/21 2329 12/14/21 2002 12/17/21 1442 12/17/21 2329 12/18/21 1030 12/19/21 0336 12/19/21 1523 12/20/21 0424  WBC 11.7*   < > 11.6*  --  8.1 7.2 7.7 7.5  NEUTROABS 8.7*  --   --   --   --   --   --   --   HGB 13.0   < > 7.2* 8.0* 8.8* 7.8* 9.0* 8.8*  HCT 38.4   < > 21.4* 23.5* 25.6* 23.7* 26.8* 26.4*  MCV 87.1   < > 87.7  --  86.2 86.5 88.7 88.3  PLT 163   < > 118*  --  126* 140* 161 194   < > = values in this interval not displayed.   Basic Metabolic Panel: Recent Labs  Lab 12/15/21 0434 12/16/21 0509 12/17/21 0539 12/19/21 0336 12/20/21 0424  NA 139 137 140 138 137  K 3.5 4.0 4.5 3.6 3.6  CL 106 106 107 103 102  CO2 23 27 27 29 29   GLUCOSE 164* 148* 125* 106* 110*  BUN 15 22 20 11 12   CREATININE 0.60 0.53 0.63 0.48 0.44  CALCIUM 8.1* 8.1* 8.0* 8.0* 8.1*  MG 1.9  --   --   --   --   PHOS 3.6  --   --   --   --    GFR: Estimated Creatinine Clearance: 60.6 mL/min (by C-G formula based on SCr of 0.44 mg/dL). Liver Function Tests: Recent Labs  Lab 12/16/21 0509  AST 19  ALT 12  ALKPHOS 44  BILITOT 0.8  PROT 5.6*  ALBUMIN 2.9*   No results for input(s): LIPASE, AMYLASE in the last 168 hours. No results for input(s): AMMONIA in the last 168 hours. Coagulation Profile: No results for input(s): INR, PROTIME in the last 168 hours. Cardiac Enzymes: No results for input(s): CKTOTAL, CKMB, CKMBINDEX, TROPONINI in the last 168 hours. BNP (last 3 results) No results for input(s): PROBNP in the last 8760 hours. HbA1C: No results for input(s): HGBA1C in the last 72  hours. CBG: No results for input(s): GLUCAP in the last 168 hours. Lipid Profile: No results for input(s): CHOL, HDL, LDLCALC, TRIG, CHOLHDL, LDLDIRECT in the last 72 hours. Thyroid Function Tests: No results for input(s): TSH, T4TOTAL, FREET4, T3FREE, THYROIDAB in the last 72 hours. Anemia Panel: No results for input(s): VITAMINB12, FOLATE, FERRITIN, TIBC, IRON, RETICCTPCT in the last 72 hours. Sepsis Labs: No results for input(s): PROCALCITON, LATICACIDVEN in the last 168 hours.  Recent Results (from the past 240 hour(s))  Resp Panel by RT-PCR (Flu A&B, Covid) Nasopharyngeal Swab     Status: None   Collection Time: 12/14/21 12:59 AM   Specimen: Nasopharyngeal Swab; Nasopharyngeal(NP) swabs in  vial transport medium  Result Value Ref Range Status   SARS Coronavirus 2 by RT PCR NEGATIVE NEGATIVE Final    Comment: (NOTE) SARS-CoV-2 target nucleic acids are NOT DETECTED.  The SARS-CoV-2 RNA is generally detectable in upper respiratory specimens during the acute phase of infection. The lowest concentration of SARS-CoV-2 viral copies this assay can detect is 138 copies/mL. A negative result does not preclude SARS-Cov-2 infection and should not be used as the sole basis for treatment or other patient management decisions. A negative result may occur with  improper specimen collection/handling, submission of specimen other than nasopharyngeal swab, presence of viral mutation(s) within the areas targeted by this assay, and inadequate number of viral copies(<138 copies/mL). A negative result must be combined with clinical observations, patient history, and epidemiological information. The expected result is Negative.  Fact Sheet for Patients:  EntrepreneurPulse.com.au  Fact Sheet for Healthcare Providers:  IncredibleEmployment.be  This test is no t yet approved or cleared by the [place] FDA and  has been authorized for detection and/or  diagnosis of SARS-CoV-2 by FDA under an Emergency Use Authorization (EUA). This EUA will remain  in effect (meaning this test can be used) for the duration of the COVID-19 declaration under Section 564(b)(1) of the Act, 21 U.S.C.section 360bbb-3(b)(1), unless the authorization is terminated  or revoked sooner.       Influenza A by PCR NEGATIVE NEGATIVE Final   Influenza B by PCR NEGATIVE NEGATIVE Final    Comment: (NOTE) The Xpert Xpress SARS-CoV-2/FLU/RSV plus assay is intended as an aid in the diagnosis of influenza from Nasopharyngeal swab specimens and should not be used as a sole basis for treatment. Nasal washings and aspirates are unacceptable for Xpert Xpress SARS-CoV-2/FLU/RSV testing.  Fact Sheet for Patients: EntrepreneurPulse.com.au  Fact Sheet for Healthcare Providers: IncredibleEmployment.be  This test is not yet approved or cleared by the [place] FDA and has been authorized for detection and/or diagnosis of SARS-CoV-2 by FDA under an Emergency Use Authorization (EUA). This EUA will remain in effect (meaning this test can be used) for the duration of the COVID-19 declaration under Section 564(b)(1) of the Act, 21 U.S.C. section 360bbb-3(b)(1), unless the authorization is terminated or revoked.  Performed at [facility], [address], [place], [place] [address]          Radiology Studies: No results found.      Scheduled Meds:  sodium chloride   Intravenous Once   atorvastatin  40 mg Oral Daily   celecoxib  100 mg Oral BID   feeding supplement  237 mL Oral BID BM   ferrous [id number] C-folic acid  1 capsule Oral BID PC   levothyroxine  50 mcg Oral Q0600   memantine  10 mg Oral BID   metoprolol succinate  50 mg Oral Daily   montelukast  10 mg Oral QHS   multivitamin with minerals  1 tablet Oral Daily   pantoprazole  40 mg Oral BID AC   senna-docusate  2 tablet Oral BID    Continuous Infusions:   LOS: 6 days   Time spent: 21min  Thorn Demas C Carmilla [place], DO [facility]  If 7PM-7AM, please contact night-coverage [email or web address]  12/20/2021, 7:42 AM

## 2021-12-20 NOTE — Progress Notes (Signed)
Physical Therapy Treatment Patient Details Name: Allison Ramos MRN: [id number] DOB: 18-Dec-1941 Today's Date: 12/20/2021   History of Present Illness Pt is a 80 y/o F with PMH: alzheimers, HLD, and OA who presented to ED after fall w/ c/o L hip pain. Found to have displaced L femoral neck fx, now s/p L THA (anterior approach) on 12/22 [name]).    PT Comments    Pt was awake laying in supine upon arriving. She is disoriented and confused. Hx of baseline cognition deficits. She was agreeable to session but required constant encouragement and motivation to fully participate. Pt's fear/anxiety with movements, greatly impacts session progression. She required max assist to exit bed and even stand from elevated bed height to RW. Upon standing, she quickly request to return to bed. Pt did participate in several there ex prior to conclusion of session. She will require extensive PT going forward to address deficits while maximizing independence with ADLs. SNF is most appropriate.    Recommendations for follow up therapy are one component of a multi-disciplinary discharge planning process, led by the attending physician.  Recommendations may be updated based on patient status, additional functional criteria and insurance authorization.  Follow Up Recommendations  Skilled nursing-short term rehab (<3 hours/day)     Assistance Recommended at Discharge Frequent or constant Supervision/Assistance  Equipment Recommendations  Other (comment) (defer to next level of care)       Precautions / Restrictions Precautions Precautions: Fall;Anterior Hip Precaution Booklet Issued: No Restrictions Weight Bearing Restrictions: Yes LLE Weight Bearing: Weight bearing as tolerated     Mobility  Bed Mobility Overal bed mobility: Needs Assistance Bed Mobility: Supine to Sit;Sit to Supine     Supine to sit: HOB elevated;Max assist Sit to supine: HOB elevated;Total assist        Transfers Overall  transfer level: Needs assistance Equipment used: Rolling walker (2 wheels) Transfers: Sit to/from Stand Sit to Stand: Max assist;From elevated surface      General transfer comment: pt stood 1 x EOB with max encouragement + max assist. pt's cognition and fear greatly slows progression. pt is physically able to perform however due to willingness was unable to trial. constantly requesting to return to supine when sitting EOB.    Ambulation/Gait      General Gait Details: unable     Balance Overall balance assessment: Needs assistance Sitting-balance support: Bilateral upper extremity supported;Feet supported Sitting balance-Leahy Scale: Fair Sitting balance - Comments: was able to sit EOB x ~ 10 minutes without LOB.   Standing balance support: Bilateral upper extremity supported;Reliant on assistive device for balance;During functional activity Standing balance-Leahy Scale: Poor Standing balance comment: pt immediately sit back down after standing       Cognition Arousal/Alertness: Awake/alert Behavior During Therapy: Anxious Overall Cognitive Status: History of cognitive impairments - at baseline        General Comments: Pt is alert however disoriented and confused. get very anxious/scared with all movements and mobility. constant encouragement           General Comments General comments (skin integrity, edema, etc.): author had pt perform ther ex with BLEs. limited ability to follow commands consistently      Pertinent Vitals/Pain Pain Assessment: PAINAD Breathing: occasional labored breathing, short period of hyperventilation Negative Vocalization: occasional moan/groan, low speech, negative/disapproving quality Facial Expression: sad, frightened, frown Body Language: tense, distressed pacing, fidgeting Consolability: distracted or reassured by voice/touch PAINAD Score: 5 Pain Location: L hip Pain Descriptors / Indicators: Grimacing;Guarding;Aching Pain  Intervention(s):  Limited activity within patient's tolerance;Monitored during session;Repositioned     PT Goals (current goals can now be found in the care plan section) Acute Rehab PT Goals Patient Stated Goal: none stated Progress towards PT goals: Progressing toward goals    Frequency    7X/week      PT Plan Current plan remains appropriate       AM-PAC PT "6 Clicks" Mobility   Outcome Measure  Help needed turning from your back to your side while in a flat bed without using bedrails?: A Lot Help needed moving from lying on your back to sitting on the side of a flat bed without using bedrails?: A Lot Help needed moving to and from a bed to a chair (including a wheelchair)?: A Lot Help needed standing up from a chair using your arms (e.g., wheelchair or bedside chair)?: A Lot Help needed to walk in hospital room?: Total Help needed climbing 3-5 steps with a railing? : Total 6 Click Score: 10    End of Session Equipment Utilized During Treatment: Gait belt Activity Tolerance: Patient tolerated treatment well Patient left: in bed;with call bell/phone within reach;with bed alarm set;with nursing/sitter in room Nurse Communication: Mobility status;Precautions PT Visit Diagnosis: Muscle weakness (generalized) (M62.81);Adult, failure to thrive (R62.7)     Time: 0812-0840 PT Time Calculation (min) (ACUTE ONLY): 28 min  Charges:  $Therapeutic Activity: 23-37 mins                    Julaine Fusi PTA 12/20/21, 9:48 AM

## 2021-12-20 NOTE — TOC Progression Note (Signed)
Transition of Care [facility]) - Progression Note    Patient Details  Name: Tationna Fullard MRN: [id number] Date of Birth: 1941-09-15  Transition of Care [facility]) CM/SW [place], RN Phone Number: 12/20/2021, 10:47 AM  Clinical Narrative:   Spoke with the patient's daughter Webb Silversmith on the phone, Reviewed the bed offers and she is leaning toward [facility], for STR SNF. She asks that any changes in treatment and or plan be reviewed with her prior to the change being made. I let her know the patient ate her breakfast really well this morning, she has a purewick in place and PT has seen her.  I explained DC could be as early as tomorrow          Expected Discharge Plan and Services                                                 Social Determinants of Health (SDOH) Interventions    Readmission Risk Interventions No flowsheet data found.

## 2021-12-20 NOTE — Progress Notes (Signed)
° °  Subjective: 6 Days Post-Op Procedure(s) (LRB): TOTAL HIP ARTHROPLASTY ANTERIOR APPROACH (Left) Patient reports pain as mild.  Tired from physical therapy.  Patient is a difficult historian.  Objective: Vital signs in last 24 hours: Temp:  [97.6 F (36.4 C)-98.7 F (37.1 C)] 98.4 F (36.9 C) (12/28 1153) Pulse Rate:  [77-88] 88 (12/28 1153) Resp:  [15-17] 16 (12/28 1153) BP: (102-166)/(52-68) 102/57 (12/28 1153) SpO2:  [96 %-99 %] 99 % (12/28 1153)  Intake/Output from previous day: 12/27 0701 - 12/28 0700 In: -  Out: 900 [Urine:900] Intake/Output this shift: Total I/O In: 300 [P.O.:300] Out: 450 [Urine:450]  Recent Labs    12/17/21 2329 12/18/21 1030 12/19/21 0336 12/19/21 1523 12/20/21 0424  HGB 8.0* 8.8* 7.8* 9.0* 8.8*   Recent Labs    12/19/21 1523 12/20/21 0424  WBC 7.7 7.5  RBC 3.02* 2.99*  HCT 26.8* 26.4*  PLT 161 194   Recent Labs    12/19/21 0336 12/20/21 0424  NA 138 137  K 3.6 3.6  CL 103 102  CO2 29 29  BUN 11 12  CREATININE 0.48 0.44  GLUCOSE 106* 110*  CALCIUM 8.0* 8.1*   No results for input(s): LABPT, INR in the last 72 hours.  EXAM General - Patient is Alert and Confused Extremity - Neurovascular intact Sensation intact distally Intact pulses distally Dorsiflexion/Plantar flexion intact No cellulitis present Compartment soft Dressing - dressing C/D/I and no drainage, Praveena intact without drainage Motor Function - intact, moving foot and toes well on exam.   Past Medical History:  Diagnosis Date   Dementia ([place])    Hyperlipidemia    Osteoarthrosis     Assessment/Plan:   6 Days Post-Op Procedure(s) (LRB): TOTAL HIP ARTHROPLASTY ANTERIOR APPROACH (Left) Principal Problem:   Left displaced femoral neck fracture (H[facility]) Active Problems:   Fracture of multiple pubic rami, left, closed, initial encounter ([place])   Dementia (HCC)   Mixed Alzheimer's and vascular dementia ([place])   Fall at home, initial encounter   Recurrent  falls   MGUS (monoclonal gammopathy of unknown significance)   Closed left hip fracture (HCC)   Hypothyroidism   Benign essential HTN   Acute blood loss as cause of postoperative anemia   Acute metabolic encephalopathy  Estimated body mass index is 23.63 kg/m as calculated from the following:   Height as of this encounter: 5\' 9"  (1.753 m).   Weight as of this encounter: 72.6 kg. Advance diet Up with therapy, weightbearing as tolerated left lower extremity Pain well controlled Vital signs are stable Acute postop blood loss anemia with  Hgb of 8.8, stable Care management to assist with discharge to SNF  Follow-up with [facility] orthopedics 2 weeks postop   DVT Prophylaxis -teds, SCDs   T. Rachelle Hora, PA-C [place] 12/20/2021, 12:01 PM

## 2021-12-20 NOTE — TOC Progression Note (Signed)
Transition of Care [facility]) - Progression Note    Patient Details  Name: Allison Ramos MRN: [id number] Date of Birth: Apr 22, 1941  Transition of Care [facility]) CM/SW [place], RN Phone Number: 12/20/2021, 10:28 AM  Clinical Narrative:   Received message from the patient's daughter Webb Silversmith, I provided her information that Pt and OT had worked with her mom, I asked her bedside nurse to call the daughter with an Update.  I provided the phone numbers for [facility] and [employer] locations to check on [place] unit Will continue to look for memory Care for the patient and provide information to her daughter         Expected Discharge Plan and Services                                                 Social Determinants of Health (SDOH) Interventions    Readmission Risk Interventions No flowsheet data found.

## 2021-12-21 DIAGNOSIS — S72002A Fracture of unspecified part of neck of left femur, initial encounter for closed fracture: Secondary | ICD-10-CM | POA: Diagnosis not present

## 2021-12-21 LAB — CREATININE, SERUM
Creatinine, Ser: 0.42 mg/dL — ABNORMAL LOW (ref 0.44–1.00)
GFR, Estimated: >60 mL/min

## 2021-12-21 NOTE — Progress Notes (Signed)
Called and gave report to [employer] at [employer].

## 2021-12-21 NOTE — TOC Progression Note (Signed)
Transition of Care [facility]) - Progression Note    Patient Details  Name: Allison Ramos MRN: [id number] Date of Birth: Mar 01, 1941  Transition of Care [facility]) CM/SW [place], RN Phone Number: 12/21/2021, 9:31 AM  Clinical Narrative:   [facility] to [facility] to [facility] today to room 610B I contacted her daughter Webb Silversmith, The bedside nurse to call report, EMS has been called and she is 2nd on the list to pick up, The bedside Nurse aware as is the Charge nurse         Expected Discharge Plan and Services           Expected Discharge Date: 12/21/21                                     Social Determinants of Health (SDOH) Interventions    Readmission Risk Interventions No flowsheet data found.

## 2021-12-21 NOTE — Care Management Important Message (Signed)
Important Message  Patient Details  Name: Allison Ramos MRN: [id number] Date of Birth: June 29, 1941   Medicare Important Message Given:  Yes     [name] 12/21/2021, 10:41 AM

## 2021-12-21 NOTE — Progress Notes (Signed)
Patient discharged to [facility] via EMS. Report was given to receiving nurse Casey Burkitt. Husband took all belongings with him.

## 2021-12-21 NOTE — Discharge Summary (Signed)
Physician Discharge Summary  Allison Ramos DEY:[id number] DOB: [birth year] DOA: 12/13/2021  PCP: Latanya Maudlin, NP  Admit date: 12/13/2021 Discharge date: 12/21/2021  Admitted From: Home Disposition: SNF  Recommendations for Outpatient Follow-up:  Follow up with PCP in 1-2 weeks Please obtain BMP/CBC in one week Please follow up with orthopedic surgery as scheduled  Discharge Condition: Stable CODE STATUS: Full Diet recommendation: As tolerated  Brief/Interim Summary: Allison Ramos is a 80 y.o. female with medical history significant for Dementia, MGUS, HLD and osteoarthritis with history of recent falls who presented to [facility] ED following a fall.  Patient is unable to contribute to history due to dementia.  Work-up revealed left femoral neck fracture, fracture of superior and inferior pubic rami.  Post left femoral neck fracture repair on 12/14/2021 by orthopedic surgery Dr. [name].   12/15/2021: no acute events overnight.  She is alert and minimally interactive.  She denies having any pain. 12/16/21 - Hb 7 requiring 1 PRBC transfusion  12/25: Hemoglobin 7.6 today.  Patient denies any obvious GI bleed, holding Lovenox 12/26: Patient is combative and agitated since last night 12/27 : Stopped Risperdal and Haldol per daughter's request.  Daughter would like to get call for any new medication before starting 12/28 no acute issues or events overnight, mental status appears to be improving per daughter at bedside    Assessment and Plan   Left displaced femoral neck fracture ([place])- (present on admission) S/p fall. Underwent THA on 12/22. Pain is controlled.  Outpatient follow-up anticoagulation and management per orthopedic surgery   Acute metabolic encephalopathy- (present on admission) Dementia with behavioral disturbances.   Worsened by hospital delirium  Discontinue Haldol Risperdal per discussion with daughter  Daughter would like to be notified before starting any  new medications.   Acute blood loss as cause of postoperative anemia, stabilizing Hemoglobin stabilizing over the past few days Status post 2 PRBC transfusion this admission CT abdomen/pelvis shows left hip Subcutaneous and intramuscular complex fluid, likely hemorrhage, no surgical intervention recommended per orthopedic surgery   Benign essential HTN- (present on admission) continue Toprol-XL   Hypothyroidism- (present on admission) Continue synthroid   MGUS (monoclonal gammopathy of unknown significance)- (present on admission) Follows with onco at [facility]   Recurrent falls Multifactorial. PT, OT recommends SNF and TOC working on placement, Daughter interested in memory care unit   Mixed Alzheimer's and vascular dementia ([place])- (present on admission) Continue Namenda. Overall poor prognosis.    Dementia ([place])- (present on admission) With behavioral disturbances.  Daughter is trying to find memory care unit. TOC aware  Discharge Instructions  Discharge Instructions     Discharge patient   Complete by: As directed    Discharge disposition: 03-Skilled [place]   Discharge patient date: 12/21/2021      Allergies as of 12/21/2021       Reactions   Darvon [propoxyphene]    Oxycodone-acetaminophen Other (See Comments)   Hypersensitivity.        Medication List     STOP taking these medications    Premarin vaginal cream Generic drug: conjugated estrogens       TAKE these medications    acetaminophen 500 MG tablet Commonly known as: TYLENOL Take 500 mg by mouth every 6 (six) hours as needed.   ASPIRIN 81 PO Take 81 mg by mouth daily.   atorvastatin 40 MG tablet Commonly known as: LIPITOR Take 40 mg by mouth daily.   cholecalciferol 25 MCG (1000 UNIT) tablet Commonly known as: VITAMIN D3  Take 5,000 Units by mouth daily.   lidocaine 5 % Commonly known as: LIDODERM Place 1 patch onto the skin daily.   Lutein 6 MG Caps Take 6 mg by mouth  daily.   memantine 10 MG tablet Commonly known as: NAMENDA Take 10 mg by mouth 2 (two) times daily.   metoprolol succinate 50 MG 24 hr tablet Commonly known as: TOPROL-XL Take 50 mg by mouth daily.   montelukast 10 MG tablet Commonly known as: SINGULAIR Take 10 mg by mouth at bedtime.   Osteo Bi-Flex One Per Day Tabs Take 1 tablet by mouth daily.   pantoprazole 20 MG tablet Commonly known as: PROTONIX Take 20 mg by mouth daily.   Synthroid 50 MCG tablet Generic drug: levothyroxine Take 50 mcg by mouth daily.        Follow-up Information     Duanne Guess, PA-C Follow up in 1 week(s).   Specialties: Orthopedic Surgery, Emergency Medicine Contact information: [place] [address] [phone]                Allergies  Allergen Reactions   Darvon [Propoxyphene]    Oxycodone-Acetaminophen Other (See Comments)    Hypersensitivity.     Consultations: Ortho   Procedures/Studies: CT ABDOMEN PELVIS WO CONTRAST  Result Date: 12/17/2021 CLINICAL DATA:  Abdominal pain.  Postop for left hip replacement. EXAM: CT ABDOMEN AND PELVIS WITHOUT CONTRAST TECHNIQUE: Multidetector CT imaging of the abdomen and pelvis was performed following the standard protocol without IV contrast. COMPARISON:  None. FINDINGS: Lower chest: Right greater than left base atelectasis or scarring. Trace right pleural fluid. Mild cardiomegaly with right coronary artery calcification. Inferior right breast nodule of 1.7 cm on 17/2. Hepatobiliary: Suspect a central right hepatic lobe 11 mm cyst on 29/2. Normal gallbladder, without biliary ductal dilatation. Pancreas: Normal, without mass or ductal dilatation. Spleen: Normal in size, without focal abnormality. Adrenals/Urinary Tract: Normal adrenal glands. No renal calculi or hydronephrosis. No abdominal ureteric stones. Degraded evaluation of the pelvis, secondary to beam hardening artifact from left hip arthroplasty. No gross  bladder calculi. Stomach/Bowel: Normal stomach, without wall thickening. 7.3 cm stool ball within the rectum. Pelvic edema which is favored to be centered about the rectum, including on 84/2. Concurrent presacral soft tissue thickening on this image. Normal terminal ileum. Normal small bowel. Vascular/Lymphatic: Aortic and branch vessel atherosclerosis. Duplicated IVC. No abdominopelvic adenopathy. Reproductive: Hysterectomy. Left ovarian/adnexal 3.1 cm fluid density lesion on 80/2. Other: No free intraperitoneal air.  No abdominal ascites. Musculoskeletal: Subcutaneous edema about the left hip and vulva, likely postoperative. Minimal ill-defined complex fluid anteriorly including on 98/2 is likely due to blood products. Trace gas in this area just deep to surgical staples. Overlying mild intramuscular fluid and probable hemorrhage including on 108/2. Left hip arthroplasty. Left sacral minimally displaced fractures including on 73/2. Left pubic rami fractures. Lumbosacral spondylosis. Mild convex left lumbar spine curvature. IMPRESSION: 1. Large stool ball in the rectum with surrounding edema, suspicious for stercoral proctitis. 2. No other explanation for abdominal pain. 3. Trace right pleural fluid. 4. Left hip arthroplasty. Subcutaneous and intramuscular complex fluid, likely hemorrhage. 5. Left pubic rami and sacral fractures. 6. Left ovarian 3.1 cm fluid density lesion. Recommend follow-up US in 6-12 months. Note: This recommendation does not apply to premenarchal patients and to those with increased risk (genetic, family history, elevated tumor markers or other high-risk factors) of ovarian cancer. Reference: JACR 2020 Feb; 17(2):248-254 7. Right breast nodule of 1.7 cm.  Consider correlation with mammogram and dedicated ultrasound. 8. Coronary artery atherosclerosis. Aortic Atherosclerosis (ICD10-I70.0). Electronically Signed   By: Abigail Miyamoto M.D.   On: 12/17/2021 15:34   DG Elbow Complete Left  Result  Date: 12/13/2021 CLINICAL DATA:  Left elbow pain following fall, initial encounter EXAM: LEFT ELBOW - COMPLETE 3+ VIEW COMPARISON:  None. FINDINGS: Elevation of the anterior fat pad is noted. Posterior fat pad is within normal limits. This is consistent with small joint effusion. No acute fracture or dislocation is noted. No other soft tissue abnormality is noted. IMPRESSION: Small joint effusion without definitive fracture. Electronically Signed   By: Inez Catalina M.D.   On: 12/13/2021 23:58   CT HEAD WO CONTRAST (5MM)  Result Date: 12/14/2021 CLINICAL DATA:  Status post fall. EXAM: CT HEAD WITHOUT CONTRAST TECHNIQUE: Contiguous axial images were obtained from the base of the skull through the vertex without intravenous contrast. COMPARISON:  October 01, 2021 FINDINGS: Brain: There is mild cerebral atrophy with widening of the extra-axial spaces and ventricular dilatation. There are areas of decreased attenuation within the white matter tracts of the supratentorial brain, consistent with microvascular disease changes. Vascular: No hyperdense vessel or unexpected calcification. Skull: Normal. Negative for acute fracture. Chronic deformities are seen involving the bilateral mandibular condyles. Sinuses/Orbits: No acute finding. Other: None. IMPRESSION: 1. Mild cerebral atrophy and microvascular disease changes of the supratentorial brain. 2. Chronic deformities are seen involving the bilateral mandibular condyles. 3. No acute intracranial abnormality. Electronically Signed   By: Virgina Norfolk M.D.   On: 12/14/2021 00:51   CT Cervical Spine Wo Contrast  Result Date: 12/14/2021 CLINICAL DATA:  Status post fall. EXAM: CT CERVICAL SPINE WITHOUT CONTRAST TECHNIQUE: Multidetector CT imaging of the cervical spine was performed without intravenous contrast. Multiplanar CT image reconstructions were also generated. COMPARISON:  None. FINDINGS: Alignment: Normal. Skull base and vertebrae: No acute fracture.  Chronic deformities are seen along the anterior aspect of the bilateral mandibular condyles. Soft tissues and spinal canal: No prevertebral fluid or swelling. No visible canal hematoma. Disc levels: Mild endplate sclerosis is seen at the levels of C2-C3 and C3-C4. Moderate to marked severity endplate sclerosis is noted at the level of C6-C7. Fusion of the C4-C5 and C5-C6 intervertebral disc spaces is seen. Moderate severity intervertebral disc space narrowing is seen at the level of C6-C7. Bilateral moderate severity multilevel facet joint hypertrophy is seen, left greater than right. Upper chest: Negative. Other: None. IMPRESSION: 1. No acute fracture or subluxation of the cervical spine. 2. Fusion of the C4-C5 and C5-C6 intervertebral disc spaces. 3. Moderate severity degenerative disc disease at the level of C6-C7. 4. Chronic deformities along the anterior aspect of the bilateral mandibular condyles. Electronically Signed   By: Virgina Norfolk M.D.   On: 12/14/2021 00:54   CT Thoracic Spine Wo Contrast  Result Date: 12/14/2021 CLINICAL DATA:  Initial evaluation for acute trauma, fall. EXAM: CT THORACIC SPINE WITHOUT CONTRAST TECHNIQUE: Multidetector CT images of the thoracic were obtained using the standard protocol without intravenous contrast. COMPARISON:  None available. FINDINGS: Alignment: Physiologic with preservation of the normal thoracic kyphosis. No listhesis. Vertebrae: Vertebral body height maintained without acute or chronic fracture. Visualized ribs intact. No discrete or worrisome osseous lesions. Paraspinal and other soft tissues: Paraspinous soft tissues demonstrate no acute finding. Mild scattered dependent atelectatic changes present within the visualized lungs. Aortic atherosclerosis. Disc levels: Mild for age multilevel disc desiccation noted within the lower thoracic spine. Mild lower cervical spondylosis. No significant  spinal stenosis. Foramina appear patent. IMPRESSION: 1. No acute  traumatic injury within the thoracic spine. 2. Aortic Atherosclerosis (ICD10-I70.0). Electronically Signed   By: Jeannine Boga M.D.   On: 12/14/2021 01:52   CT Lumbar Spine Wo Contrast  Result Date: 12/14/2021 CLINICAL DATA:  Initial evaluation for acute trauma, fall. EXAM: CT LUMBAR SPINE WITHOUT CONTRAST TECHNIQUE: Multidetector CT imaging of the lumbar spine was performed without intravenous contrast administration. Multiplanar CT image reconstructions were also generated. COMPARISON:  Prior CT from 10/01/2021. FINDINGS: Segmentation: Standard. Lowest well-formed disc space labeled the L5-S1 level. Alignment: Levoscoliosis with apex at L3. Trace retrolisthesis of L3 on L4, chronic and facet mediated, and stable from prior. Vertebrae: Vertebral body height maintained without acute or chronic fracture. There is an acute minimally displaced fracture extending through the left sacral ala (series 4, image 126). Involvement of both the S1 and S2 segments. SI joints remain approximated and symmetric. Remainder of the visualized bony pelvis otherwise intact. No other acute fracture. Visualized lower ribs intact. No discrete or worrisome osseous lesions. Paraspinal and other soft tissues: Mild edema adjacent to the left sacral fracture. Paraspinous soft tissues demonstrate no other acute finding. Moderate aortic atherosclerosis. 3.3 cm left ovarian cyst partially visualized (series 5, image 141). Disc levels: L1-2:  Negative interspace.  Mild facet hypertrophy.  No stenosis. L2-3: Degenerative intervertebral disc space narrowing with mild disc bulge, asymmetric to the right. Moderate bilateral facet hypertrophy. Resultant severe spinal stenosis. Moderate right L2 foraminal narrowing. Left neural foramen remains patent. Appearance is stable. L3-4: Moderate to advanced degenerative intervertebral disc space narrowing with diffuse disc bulge and disc desiccation. Disc bulging asymmetric to the right. Moderate  right worse than left facet hypertrophy. Resultant moderate canal with right worse than left lateral recess stenosis. Moderate right with mild left L3 foraminal narrowing. Appearance is stable. L4-5: Moderate degenerative intervertebral disc space narrowing with diffuse disc bulge and disc desiccation. Moderate facet and ligament flavum hypertrophy. Resultant moderate canal with left worse than right lateral recess stenosis. Moderate left L4 foraminal narrowing. Right neural foramina remains patent. Appearance is stable. L5-S1: Degenerative intervertebral disc space narrowing with diffuse disc bulge. Associated reactive endplate spurring, greater on the left. Severe left with moderate right facet arthrosis. Resultant mild to moderate narrowing of the left lateral recess. Mild right with moderate left L5 foraminal stenosis. Appearance is stable. IMPRESSION: 1. Acute mildly displaced fracture involving the left sacral ala. 2. No other acute traumatic injury within the lumbar spine. 3. 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 Associated moderate right L2 and L3 foraminal stenosis, with moderate left L4 and L5 foraminal narrowing. Appearance is stable from prior. 4. 3.3 cm left ovarian cyst, indeterminate. Recommend follow-up US in 6-12 months. Note: This recommendation does not apply to premenarchal patients and to those with increased risk (genetic, family history, elevated tumor markers or other high-risk factors) of ovarian cancer. Reference: JACR 2020 Feb; 17(2):248-254 5. Aortic Atherosclerosis (ICD10-I70.0). Electronically Signed   By: Jeannine Boga M.D.   On: 12/14/2021 02:16   DG Chest Port 1 View  Result Date: 12/13/2021 CLINICAL DATA:  Recent fall with chest pain, initial encounter EXAM: PORTABLE CHEST 1 VIEW COMPARISON:  07/03/2020 FINDINGS: Cardiac shadow is  accentuated by the frontal technique. Tortuous thoracic aorta is seen. Mild vascular congestion is noted without interstitial edema. No acute bony abnormality is seen. IMPRESSION: Vascular congestion without edema. No  focal confluent infiltrate is seen. Electronically Signed   By: Inez Catalina M.D.   On: 12/13/2021 23:59   DG Hand Complete Left  Result Date: 12/13/2021 CLINICAL DATA:  Recent fall with left hand pain, initial encounter EXAM: LEFT HAND - COMPLETE 3+ VIEW COMPARISON:  None. FINDINGS: Degenerative changes are noted the first [facility] joint. No acute fracture or dislocation is noted. No soft tissue changes are seen. IMPRESSION: Degenerative change without acute abnormality. Electronically Signed   By: Inez Catalina M.D.   On: 12/13/2021 23:58   DG C-Arm 1-60 Min-No Report  Result Date: 12/14/2021 Fluoroscopy was utilized by the requesting physician.  No radiographic interpretation.   DG HIP UNILAT WITH PELVIS 1V LEFT  Result Date: 12/14/2021 CLINICAL DATA:  Left femoral neck fracture EXAM: DG HIP (WITH OR WITHOUT PELVIS) 1V*L* COMPARISON:  12/13/2021 FLUOROSCOPY TIME:  Radiation Exposure Index (as provided by the fluoroscopic device): 0.76 mGy If the device does not provide the exposure index: Fluoroscopy Time:  7 seconds Number of Acquired Images:  2 FINDINGS: Initial image again demonstrates left femoral neck fracture. Subsequent image shows a left hip replacement in satisfactory position. IMPRESSION: Left hip replacement Electronically Signed   By: Inez Catalina M.D.   On: 12/14/2021 17:17   DG HIP UNILAT W OR W/O PELVIS 2-3 VIEWS LEFT  Result Date: 12/14/2021 CLINICAL DATA:  S/p left total hip replacement with cement EXAM: DG HIP (WITH OR WITHOUT PELVIS) 2-3V LEFT COMPARISON:  X-ray hip left 12/13/2021 FINDINGS: Status post total left hip arthroplasty. Subcutaneus soft tissue edema and emphysema consistent with surgical changes. Overlying skin staples. Overlying wound VAC.  Redemonstration of inferior and superior pubic rami acute nondisplaced/minimally displaced fractures. There is no evidence of severe arthropathy or other focal bone abnormality. IMPRESSION: 1. Status post total left hip arthroplasty. 2. Redemonstration of left inferior and superior pubic rami acute nondisplaced/minimally displaced fractures. Electronically Signed   By: Iven Finn M.D.   On: 12/14/2021 17:57   DG HIP UNILAT W OR W/O PELVIS 2-3 VIEWS LEFT  Result Date: 12/13/2021 CLINICAL DATA:  Left hip pain following fall, initial encounter EXAM: DG HIP (WITH OR WITHOUT PELVIS) 3V LEFT COMPARISON:  None. FINDINGS: Pelvic ring is intact. There is a left femoral neck fracture identified with impaction and angulation at the fracture site. Additionally fractures through the superior and inferior pubic rami on the left are seen. Degenerative changes of lumbar spine are noted. No other focal abnormality is seen. IMPRESSION: Left femoral neck fracture with associated superior and inferior pubic rami fractures. Electronically Signed   By: Inez Catalina M.D.   On: 12/13/2021 23:56     Subjective: No acute issues/events overnight   Discharge Exam: Vitals:   12/21/21 0414 12/21/21 0720  BP: (!) 143/65 (!) 129/56  Pulse: 77 74  Resp: 14 15  Temp: 98.5 F (36.9 C) 98.6 F (37 C)  SpO2: 97% 96%   Vitals:   12/20/21 1945 12/20/21 2320 12/21/21 0414 12/21/21 0720  BP: (!) 137/54 (!) 126/55 (!) 143/65 (!) 129/56  Pulse: 80 81 77 74  Resp: 17 15 14 15   Temp: 98.5 F (36.9 C) 98.7 F (37.1 C) 98.5 F (36.9 C) 98.6 F (37 C)  TempSrc:    Oral  SpO2: 99% 97% 97% 96%  Weight:      Height:        General: Pt is alert, awake, not in acute distress Cardiovascular: RRR, S1/S2 +, no rubs, no gallops Respiratory: CTA bilaterally,  no wheezing, no rhonchi Abdominal: Soft, NT, ND, bowel sounds + Extremities: no edema, no cyanosis    The results of significant diagnostics from this hospitalization  (including imaging, microbiology, ancillary and laboratory) are listed below for reference.     Microbiology: Recent Results (from the past 240 hour(s))  Resp Panel by RT-PCR (Flu A&B, Covid) Nasopharyngeal Swab     Status: None   Collection Time: 12/14/21 12:59 AM   Specimen: Nasopharyngeal Swab; Nasopharyngeal(NP) swabs in vial transport medium  Result Value Ref Range Status   SARS Coronavirus 2 by RT PCR NEGATIVE NEGATIVE Final    Comment: (NOTE) SARS-CoV-2 target nucleic acids are NOT DETECTED.  The SARS-CoV-2 RNA is generally detectable in upper respiratory specimens during the acute phase of infection. The lowest concentration of SARS-CoV-2 viral copies this assay can detect is 138 copies/mL. A negative result does not preclude SARS-Cov-2 infection and should not be used as the sole basis for treatment or other patient management decisions. A negative result may occur with  improper specimen collection/handling, submission of specimen other than nasopharyngeal swab, presence of viral mutation(s) within the areas targeted by this assay, and inadequate number of viral copies(<138 copies/mL). A negative result must be combined with clinical observations, patient history, and epidemiological information. The expected result is Negative.  Fact Sheet for Patients:  EntrepreneurPulse.com.au  Fact Sheet for Healthcare Providers:  IncredibleEmployment.be  This test is no t yet approved or cleared by the [place] FDA and  has been authorized for detection and/or diagnosis of SARS-CoV-2 by FDA under an Emergency Use Authorization (EUA). This EUA will remain  in effect (meaning this test can be used) for the duration of the COVID-19 declaration under Section 564(b)(1) of the Act, 21 U.S.C.section 360bbb-3(b)(1), unless the authorization is terminated  or revoked sooner.       Influenza A by PCR NEGATIVE NEGATIVE Final   Influenza B by PCR  NEGATIVE NEGATIVE Final    Comment: (NOTE) The Xpert Xpress SARS-CoV-2/FLU/RSV plus assay is intended as an aid in the diagnosis of influenza from Nasopharyngeal swab specimens and should not be used as a sole basis for treatment. Nasal washings and aspirates are unacceptable for Xpert Xpress SARS-CoV-2/FLU/RSV testing.  Fact Sheet for Patients: EntrepreneurPulse.com.au  Fact Sheet for Healthcare Providers: IncredibleEmployment.be  This test is not yet approved or cleared by the [place] FDA and has been authorized for detection and/or diagnosis of SARS-CoV-2 by FDA under an Emergency Use Authorization (EUA). This EUA will remain in effect (meaning this test can be used) for the duration of the COVID-19 declaration under Section 564(b)(1) of the Act, 21 U.S.C. section 360bbb-3(b)(1), unless the authorization is terminated or revoked.  Performed at [facility], [address] [place], [place] [address]   Resp Panel by RT-PCR (Flu A&B, Covid) Nasopharyngeal Swab     Status: None   Collection Time: 12/20/21  3:13 PM   Specimen: Nasopharyngeal Swab; Nasopharyngeal(NP) swabs in vial transport medium  Result Value Ref Range Status   SARS Coronavirus 2 by RT PCR NEGATIVE NEGATIVE Final    Comment: (NOTE) SARS-CoV-2 target nucleic acids are NOT DETECTED.  The SARS-CoV-2 RNA is generally detectable in upper respiratory specimens during the acute phase of infection. The lowest concentration of SARS-CoV-2 viral copies this assay can detect is 138 copies/mL. A negative result does not preclude SARS-Cov-2 infection and should not be used as the sole basis for treatment or other patient management decisions. A negative result may occur with  improper specimen collection/handling, submission of specimen other than nasopharyngeal swab, presence of viral mutation(s) within the areas targeted by this assay, and inadequate number of  viral copies(<138 copies/mL). A negative result must be combined with clinical observations, patient history, and epidemiological information. The expected result is Negative.  Fact Sheet for Patients:  EntrepreneurPulse.com.au  Fact Sheet for Healthcare Providers:  IncredibleEmployment.be  This test is no t yet approved or cleared by the [place] FDA and  has been authorized for detection and/or diagnosis of SARS-CoV-2 by FDA under an Emergency Use Authorization (EUA). This EUA will remain  in effect (meaning this test can be used) for the duration of the COVID-19 declaration under Section 564(b)(1) of the Act, 21 U.S.C.section 360bbb-3(b)(1), unless the authorization is terminated  or revoked sooner.       Influenza A by PCR NEGATIVE NEGATIVE Final   Influenza B by PCR NEGATIVE NEGATIVE Final    Comment: (NOTE) The Xpert Xpress SARS-CoV-2/FLU/RSV plus assay is intended as an aid in the diagnosis of influenza from Nasopharyngeal swab specimens and should not be used as a sole basis for treatment. Nasal washings and aspirates are unacceptable for Xpert Xpress SARS-CoV-2/FLU/RSV testing.  Fact Sheet for Patients: EntrepreneurPulse.com.au  Fact Sheet for Healthcare Providers: IncredibleEmployment.be  This test is not yet approved or cleared by the [place] FDA and has been authorized for detection and/or diagnosis of SARS-CoV-2 by FDA under an Emergency Use Authorization (EUA). This EUA will remain in effect (meaning this test can be used) for the duration of the COVID-19 declaration under Section 564(b)(1) of the Act, 21 U.S.C. section 360bbb-3(b)(1), unless the authorization is terminated or revoked.  Performed at [facility], [address] [place], [place] [address]      Labs: BNP (last 3 results) No results for input(s): BNP in the last 8760 hours. Basic Metabolic  Panel: Recent Labs  Lab 12/15/21 0434 12/16/21 0509 12/17/21 0539 12/19/21 0336 12/20/21 0424 12/21/21 0610  NA 139 137 140 138 137  --   K 3.5 4.0 4.5 3.6 3.6  --   CL 106 106 107 103 102  --   CO2 23 27 27 29 29   --   GLUCOSE 164* 148* 125* 106* 110*  --   BUN 15 22 20 11 12   --   CREATININE 0.60 0.53 0.63 0.48 0.44 0.42*  CALCIUM 8.1* 8.1* 8.0* 8.0* 8.1*  --   MG 1.9  --   --   --   --   --   PHOS 3.6  --   --   --   --   --    Liver Function Tests: Recent Labs  Lab 12/16/21 0509  AST 19  ALT 12  ALKPHOS 44  BILITOT 0.8  PROT 5.6*  ALBUMIN 2.9*   No results for input(s): LIPASE, AMYLASE in the last 168 hours. No results for input(s): AMMONIA in the last 168 hours. CBC: Recent Labs  Lab 12/17/21 1442 12/17/21 2329 12/18/21 1030 12/19/21 0336 12/19/21 1523 12/20/21 0424  WBC 11.6*  --  8.1 7.2 7.7 7.5  HGB 7.2* 8.0* 8.8* 7.8* 9.0* 8.8*  HCT 21.4* 23.5* 25.6* 23.7* 26.8* 26.4*  MCV 87.7  --  86.2 86.5 88.7 88.3  PLT 118*  --  126* 140* 161 194   Cardiac Enzymes: No results for input(s): CKTOTAL, CKMB, CKMBINDEX, TROPONINI in the last 168 hours. BNP: Invalid input(s): POCBNP CBG: No results for input(s): GLUCAP in the last 168 hours. D-Dimer  No results for input(s): DDIMER in the last 72 hours. Hgb A1c No results for input(s): HGBA1C in the last 72 hours. Lipid Profile No results for input(s): CHOL, HDL, LDLCALC, TRIG, CHOLHDL, LDLDIRECT in the last 72 hours. Thyroid function studies No results for input(s): TSH, T4TOTAL, T3FREE, THYROIDAB in the last 72 hours.  Invalid input(s): FREET3 Anemia work up No results for input(s): VITAMINB12, FOLATE, FERRITIN, TIBC, IRON, RETICCTPCT in the last 72 hours. Urinalysis    Component Value Date/Time   COLORURINE YELLOW 12/14/2021 1248   APPEARANCEUR CLEAR (A) 12/14/2021 1248   LABSPEC 1.025 12/14/2021 1248   PHURINE 6.5 12/14/2021 1248   GLUCOSEU NEGATIVE 12/14/2021 1248   HGBUR TRACE (A) 12/14/2021 1248    BILIRUBINUR NEGATIVE 12/14/2021 1248   KETONESUR NEGATIVE 12/14/2021 1248   PROTEINUR NEGATIVE 12/14/2021 1248   NITRITE NEGATIVE 12/14/2021 1248   LEUKOCYTESUR NEGATIVE 12/14/2021 1248   Sepsis Labs Invalid input(s): PROCALCITONIN,  WBC,  LACTICIDVEN Microbiology Recent Results (from the past 240 hour(s))  Resp Panel by RT-PCR (Flu A&B, Covid) Nasopharyngeal Swab     Status: None   Collection Time: 12/14/21 12:59 AM   Specimen: Nasopharyngeal Swab; Nasopharyngeal(NP) swabs in vial transport medium  Result Value Ref Range Status   SARS Coronavirus 2 by RT PCR NEGATIVE NEGATIVE Final    Comment: (NOTE) SARS-CoV-2 target nucleic acids are NOT DETECTED.  The SARS-CoV-2 RNA is generally detectable in upper respiratory specimens during the acute phase of infection. The lowest concentration of SARS-CoV-2 viral copies this assay can detect is 138 copies/mL. A negative result does not preclude SARS-Cov-2 infection and should not be used as the sole basis for treatment or other patient management decisions. A negative result may occur with  improper specimen collection/handling, submission of specimen other than nasopharyngeal swab, presence of viral mutation(s) within the areas targeted by this assay, and inadequate number of viral copies(<138 copies/mL). A negative result must be combined with clinical observations, patient history, and epidemiological information. The expected result is Negative.  Fact Sheet for Patients:  EntrepreneurPulse.com.au  Fact Sheet for Healthcare Providers:  IncredibleEmployment.be  This test is no t yet approved or cleared by the [place] FDA and  has been authorized for detection and/or diagnosis of SARS-CoV-2 by FDA under an Emergency Use Authorization (EUA). This EUA will remain  in effect (meaning this test can be used) for the duration of the COVID-19 declaration under Section 564(b)(1) of the Act,  21 U.S.C.section 360bbb-3(b)(1), unless the authorization is terminated  or revoked sooner.       Influenza A by PCR NEGATIVE NEGATIVE Final   Influenza B by PCR NEGATIVE NEGATIVE Final    Comment: (NOTE) The Xpert Xpress SARS-CoV-2/FLU/RSV plus assay is intended as an aid in the diagnosis of influenza from Nasopharyngeal swab specimens and should not be used as a sole basis for treatment. Nasal washings and aspirates are unacceptable for Xpert Xpress SARS-CoV-2/FLU/RSV testing.  Fact Sheet for Patients: EntrepreneurPulse.com.au  Fact Sheet for Healthcare Providers: IncredibleEmployment.be  This test is not yet approved or cleared by the [place] FDA and has been authorized for detection and/or diagnosis of SARS-CoV-2 by FDA under an Emergency Use Authorization (EUA). This EUA will remain in effect (meaning this test can be used) for the duration of the COVID-19 declaration under Section 564(b)(1) of the Act, 21 U.S.C. section 360bbb-3(b)(1), unless the authorization is terminated or revoked.  Performed at [facility], [address] [place], [place] [address]   Resp Panel by  RT-PCR (Flu A&B, Covid) Nasopharyngeal Swab     Status: None   Collection Time: 12/20/21  3:13 PM   Specimen: Nasopharyngeal Swab; Nasopharyngeal(NP) swabs in vial transport medium  Result Value Ref Range Status   SARS Coronavirus 2 by RT PCR NEGATIVE NEGATIVE Final    Comment: (NOTE) SARS-CoV-2 target nucleic acids are NOT DETECTED.  The SARS-CoV-2 RNA is generally detectable in upper respiratory specimens during the acute phase of infection. The lowest concentration of SARS-CoV-2 viral copies this assay can detect is 138 copies/mL. A negative result does not preclude SARS-Cov-2 infection and should not be used as the sole basis for treatment or other patient management decisions. A negative result may occur with  improper specimen  collection/handling, submission of specimen other than nasopharyngeal swab, presence of viral mutation(s) within the areas targeted by this assay, and inadequate number of viral copies(<138 copies/mL). A negative result must be combined with clinical observations, patient history, and epidemiological information. The expected result is Negative.  Fact Sheet for Patients:  EntrepreneurPulse.com.au  Fact Sheet for Healthcare Providers:  IncredibleEmployment.be  This test is no t yet approved or cleared by the [place] FDA and  has been authorized for detection and/or diagnosis of SARS-CoV-2 by FDA under an Emergency Use Authorization (EUA). This EUA will remain  in effect (meaning this test can be used) for the duration of the COVID-19 declaration under Section 564(b)(1) of the Act, 21 U.S.C.section 360bbb-3(b)(1), unless the authorization is terminated  or revoked sooner.       Influenza A by PCR NEGATIVE NEGATIVE Final   Influenza B by PCR NEGATIVE NEGATIVE Final    Comment: (NOTE) The Xpert Xpress SARS-CoV-2/FLU/RSV plus assay is intended as an aid in the diagnosis of influenza from Nasopharyngeal swab specimens and should not be used as a sole basis for treatment. Nasal washings and aspirates are unacceptable for Xpert Xpress SARS-CoV-2/FLU/RSV testing.  Fact Sheet for Patients: EntrepreneurPulse.com.au  Fact Sheet for Healthcare Providers: IncredibleEmployment.be  This test is not yet approved or cleared by the [place] FDA and has been authorized for detection and/or diagnosis of SARS-CoV-2 by FDA under an Emergency Use Authorization (EUA). This EUA will remain in effect (meaning this test can be used) for the duration of the COVID-19 declaration under Section 564(b)(1) of the Act, 21 U.S.C. section 360bbb-3(b)(1), unless the authorization is terminated or revoked.  Performed at [facility], [address] [place], [place] [address]      Time coordinating discharge: Over 30 minutes  SIGNED:   Little Ishikawa, DO [facility] 12/21/2021, 9:07 AM Pager   If 7PM-7AM, please contact night-coverage [email or web address]

## 2021-12-21 NOTE — Plan of Care (Signed)
°  Problem: Education: Goal: Verbalization of understanding the information provided (i.e., activity precautions, restrictions, etc) will improve Outcome: Adequate for Discharge Goal: Individualized Educational Video(s) Outcome: Adequate for Discharge   Problem: Activity: Goal: Ability to ambulate and perform ADLs will improve Outcome: Adequate for Discharge   Problem: Clinical Measurements: Goal: Postoperative complications will be avoided or minimized Outcome: Adequate for Discharge   Problem: Self-Concept: Goal: Ability to maintain and perform role responsibilities to the fullest extent possible will improve Outcome: Adequate for Discharge   Problem: Pain Management: Goal: Pain level will decrease Outcome: Adequate for Discharge   Problem: Education: Goal: Knowledge of General Education information will improve Description: Including pain rating scale, medication(s)/side effects and non-pharmacologic comfort measures Outcome: Adequate for Discharge   Problem: Health Behavior/Discharge Planning: Goal: Ability to manage health-related needs will improve Outcome: Adequate for Discharge   Problem: Clinical Measurements: Goal: Ability to maintain clinical measurements within normal limits will improve Outcome: Adequate for Discharge Goal: Will remain free from infection Outcome: Adequate for Discharge Goal: Diagnostic test results will improve Outcome: Adequate for Discharge Goal: Respiratory complications will improve Outcome: Adequate for Discharge Goal: Cardiovascular complication will be avoided Outcome: Adequate for Discharge   Problem: Activity: Goal: Risk for activity intolerance will decrease Outcome: Adequate for Discharge   Problem: Nutrition: Goal: Adequate nutrition will be maintained Outcome: Adequate for Discharge   Problem: Coping: Goal: Level of anxiety will decrease Outcome: Adequate for Discharge   Problem: Elimination: Goal: Will not experience  complications related to bowel motility Outcome: Adequate for Discharge Goal: Will not experience complications related to urinary retention Outcome: Adequate for Discharge   Problem: Pain Managment: Goal: General experience of comfort will improve Outcome: Adequate for Discharge   Problem: Safety: Goal: Ability to remain free from injury will improve Outcome: Adequate for Discharge   Problem: Skin Integrity: Goal: Risk for impaired skin integrity will decrease Outcome: Adequate for Discharge

## 2022-03-06 ENCOUNTER — Encounter: Payer: Self-pay | Admitting: Urology

## 2022-03-07 ENCOUNTER — Ambulatory Visit (INDEPENDENT_AMBULATORY_CARE_PROVIDER_SITE_OTHER): Payer: Medicare Other | Admitting: Urology

## 2022-03-07 ENCOUNTER — Other Ambulatory Visit: Payer: Self-pay

## 2022-03-08 NOTE — Progress Notes (Signed)
Patient with dementia.  Referred for recurrent UTI.  Was unable to give a urine specimen.  Caregiver states she would be unable to be catheterized secondary to the possibility of combativeness.  Urine will be ordered at her SNF and appointment rescheduled ?

## 2022-06-11 IMAGING — CT CT HEAD W/O CM
4 series · 17 of 47 positions shown, 19 images · non-contrast
Comparison: October 01, 2021

CLINICAL DATA: Status post fall.

EXAM:
CT HEAD WITHOUT CONTRAST
TECHNIQUE: Contiguous axial images were obtained from the base of the skull
through the vertex without intravenous contrast.

[Series 2: head wo · axial · 0.40mm/px · z∈[+492,+602]mm · 7 of 30 slices shown, 9 images]
[im 4/30  brain]
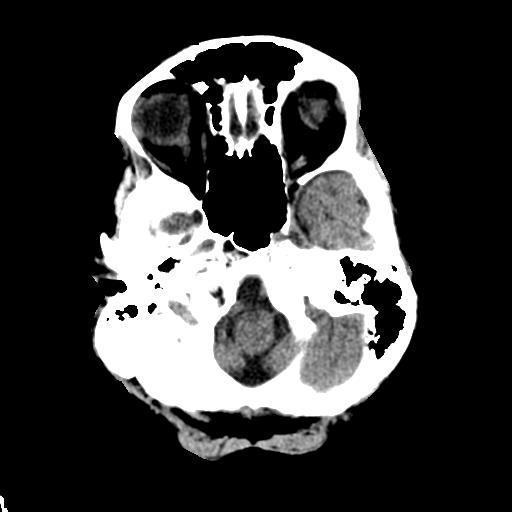
[im 4/30  bone]
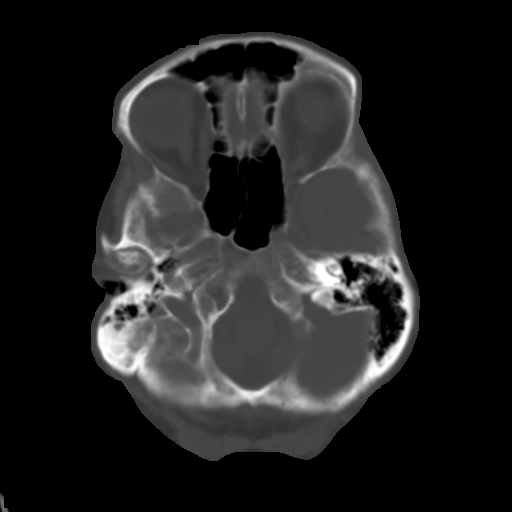
[im 8/30  brain]
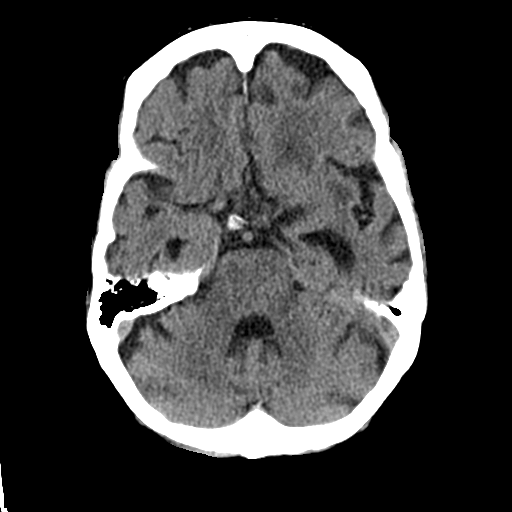
[im 11/30  brain]
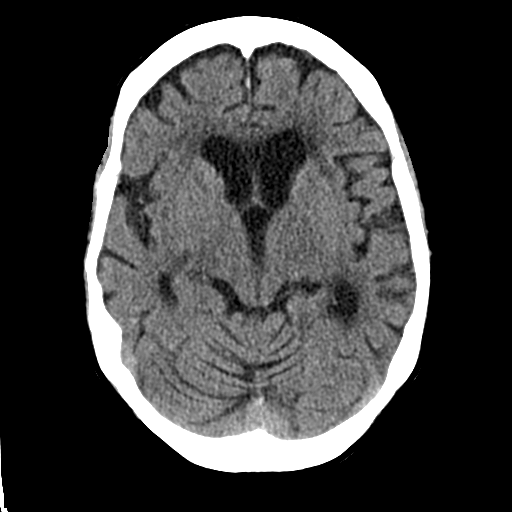
[im 15/30  brain]
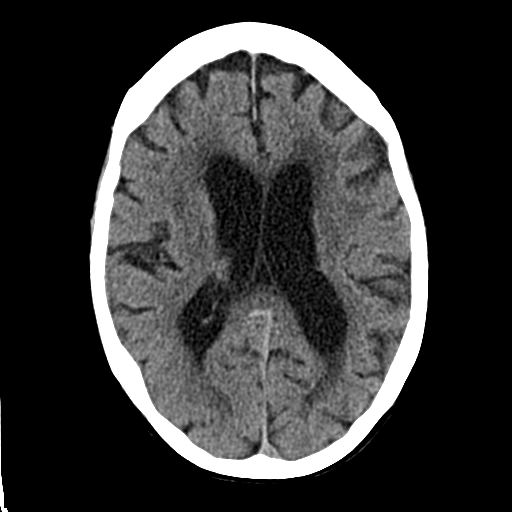
[im 19/30  brain]
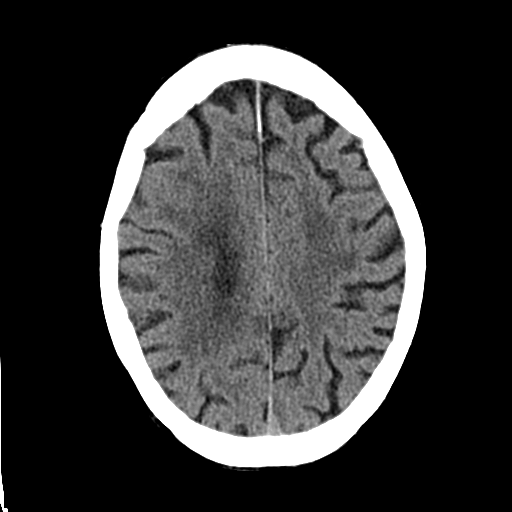
[im 19/30  bone]
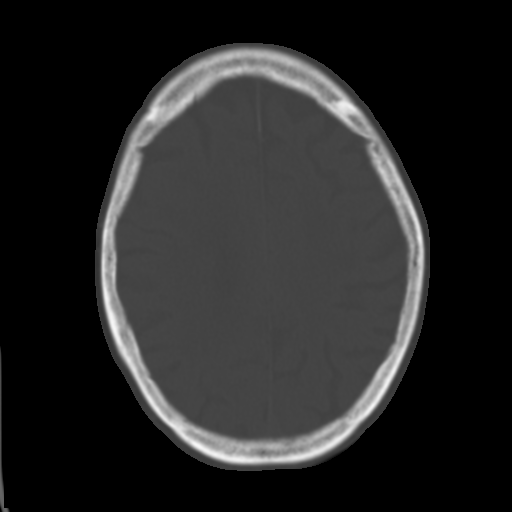
[im 22/30  brain]
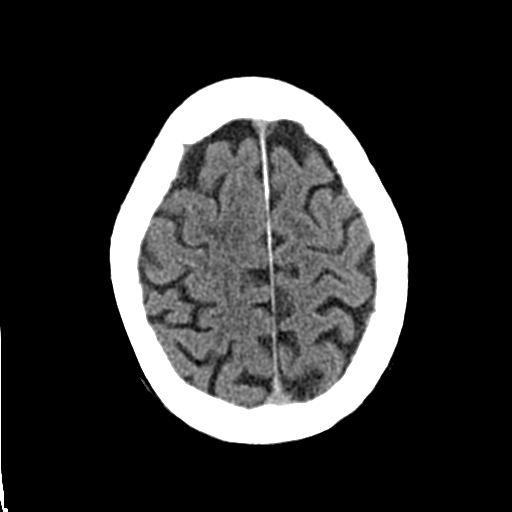
[im 26/30  brain]
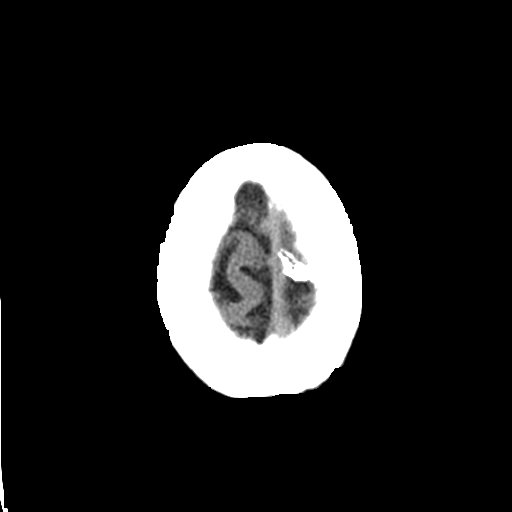

[Series 3: head bone · axial · 0.40mm/px · z∈[+491,+543]mm · 4 of 75 slices shown]
[im 8/75  bone]
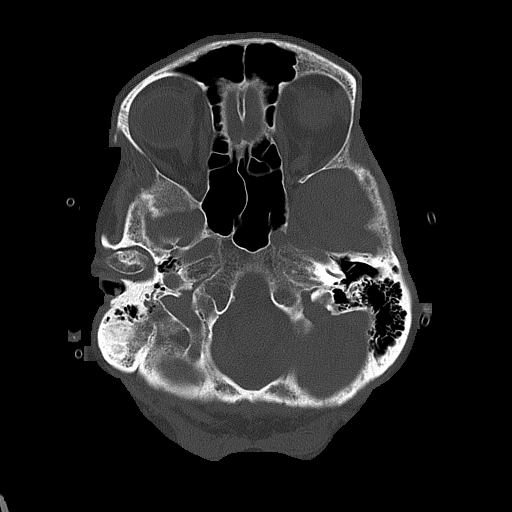
[im 15/75  bone]
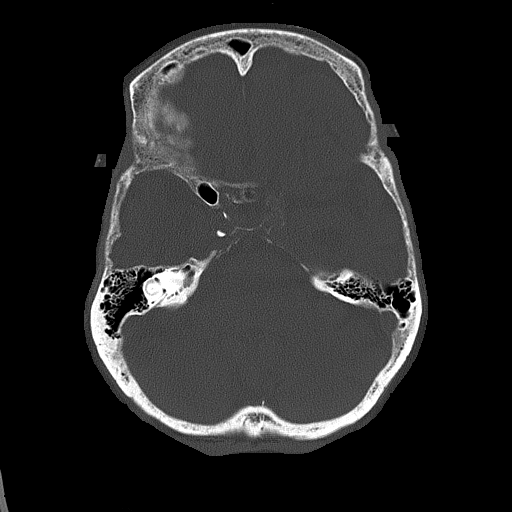
[im 23/75  bone]
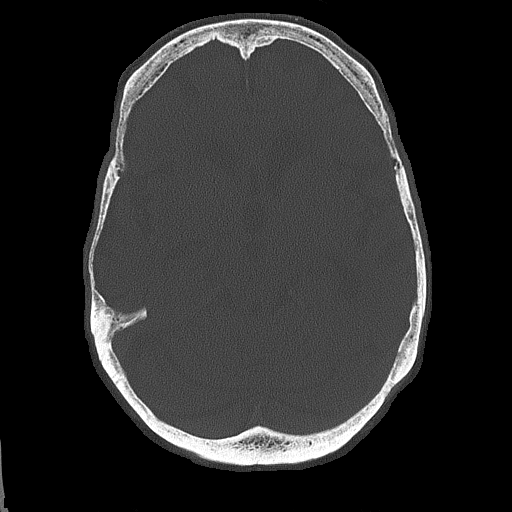
[im 34/75  bone]
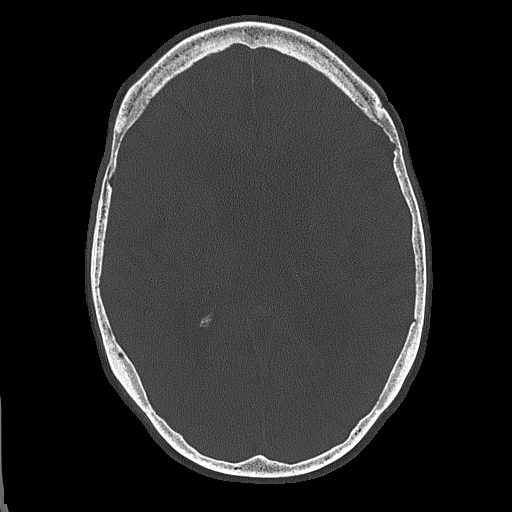

[Series 4: coronal soft tissue · coronal · 0.31mm/px · 3 of 64 slices shown]
[im 22/64  brain]
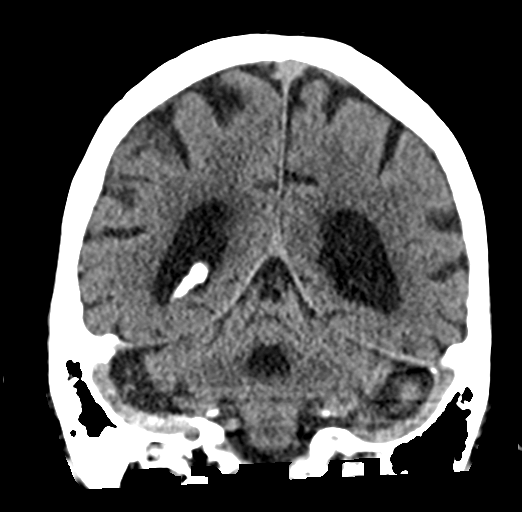
[im 29/64  brain]
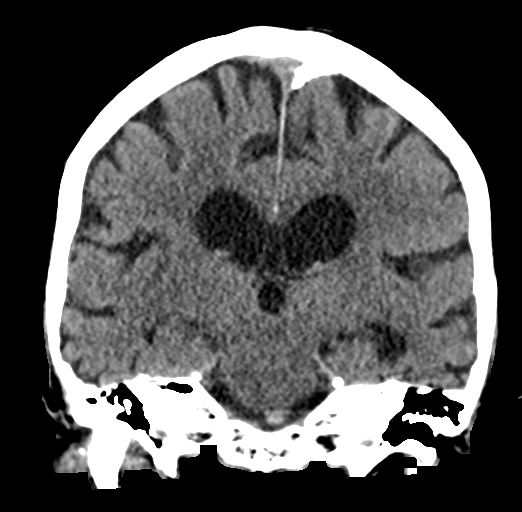
[im 36/64  brain]
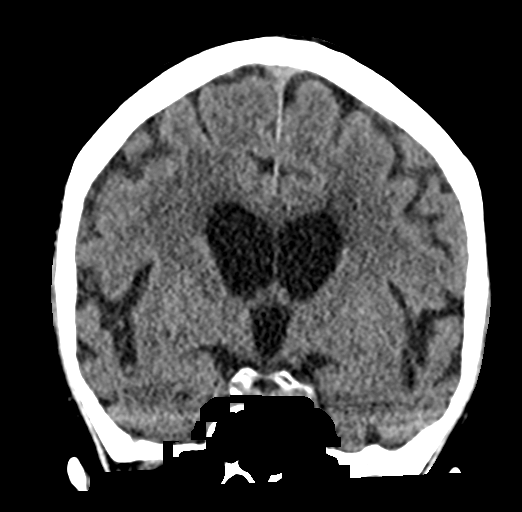

[Series 5: sagittal soft tissue · sagittal · 0.31mm/px · 3 of 52 slices shown]
[im 18/52  brain]
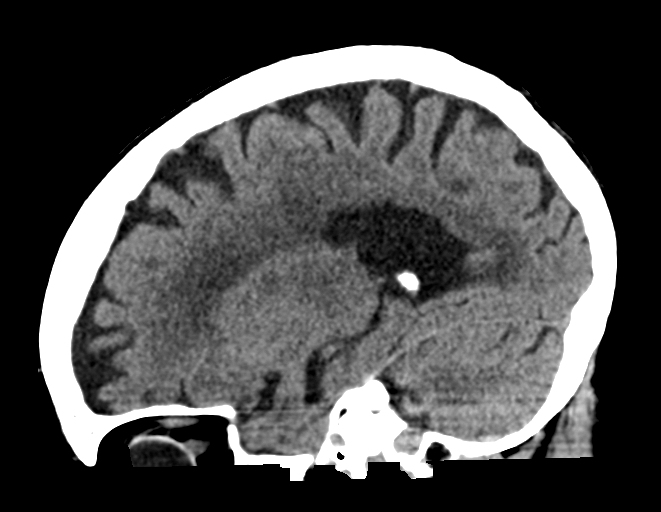
[im 26/52  brain]
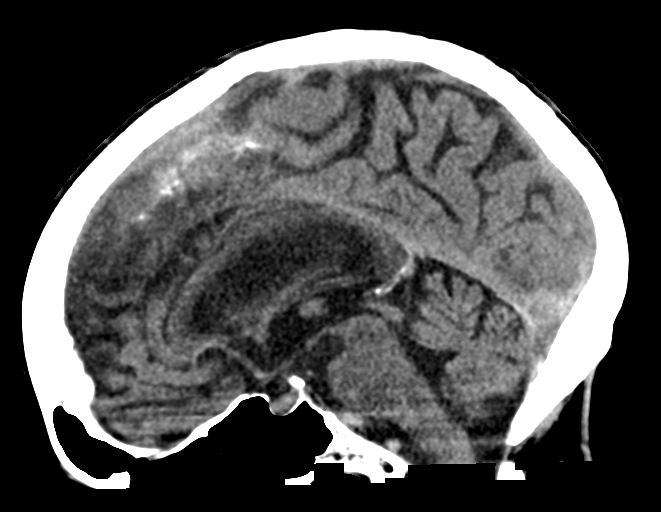
[im 35/52  brain]
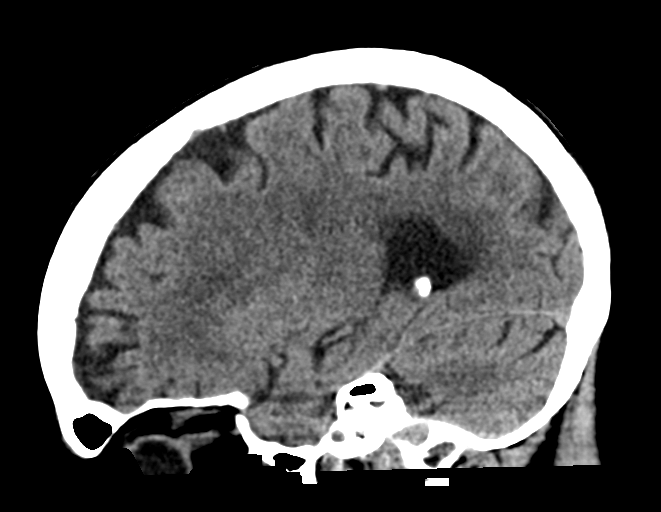

[17 of 47 positions shown; findings below may reference images not displayed]

FINDINGS: Brain: There is mild cerebral atrophy with widening of the
extra-axial spaces and ventricular dilatation.
There are areas of decreased attenuation within the white matter
tracts of the supratentorial brain, consistent with microvascular
disease changes.

Vascular: No hyperdense vessel or unexpected calcification.

Skull: Normal. Negative for acute fracture. Chronic deformities are
seen involving the bilateral mandibular condyles.

Sinuses/Orbits: No acute finding.

Other: None.
IMPRESSION: 1. Mild cerebral atrophy and microvascular disease changes of the
supratentorial brain.
2. Chronic deformities are seen involving the bilateral mandibular
condyles.
3. No acute intracranial abnormality.

## 2022-06-14 IMAGING — CT CT ABD-PELV W/O CM
2 of 4 series · 14 of 46 positions shown, 16 images · non-contrast
Comparison: None.

CLINICAL DATA: Abdominal pain.  Postop for left hip replacement.

EXAM:
CT ABDOMEN AND PELVIS WITHOUT CONTRAST
TECHNIQUE: Multidetector CT imaging of the abdomen and pelvis was performed
following the standard protocol without IV contrast.

[Series 2: routine abd/pel wo · axial · 0.88mm/px · z∈[-970,-495]mm · 11 of 110 slices shown, 13 images]
[im 10/110  soft-tissue]
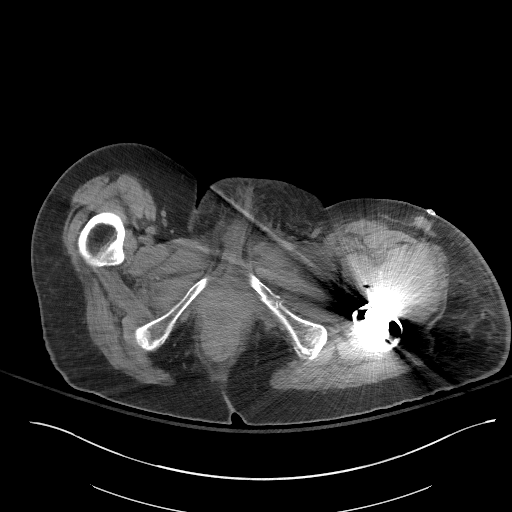
[im 10/110  bone]
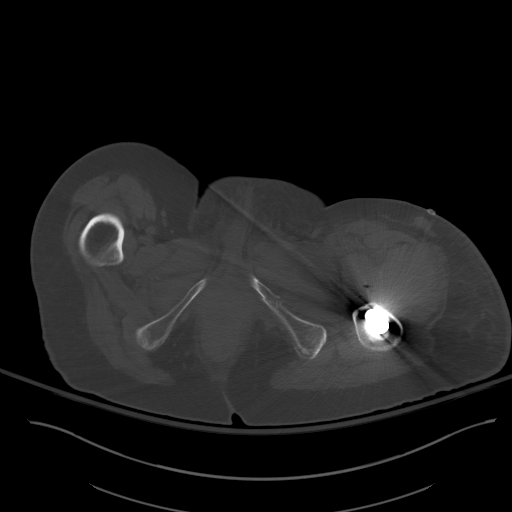
[im 19/110  soft-tissue]
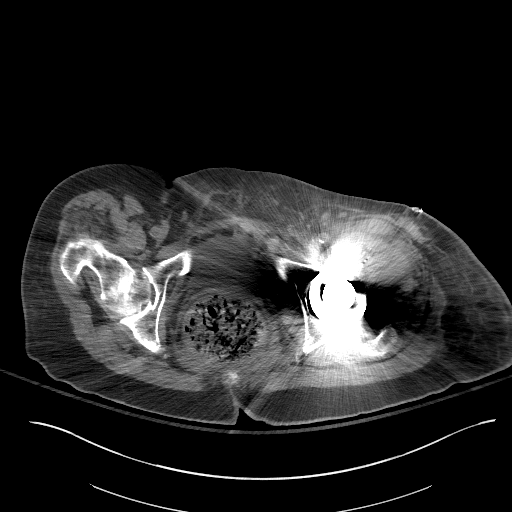
[im 29/110  soft-tissue]
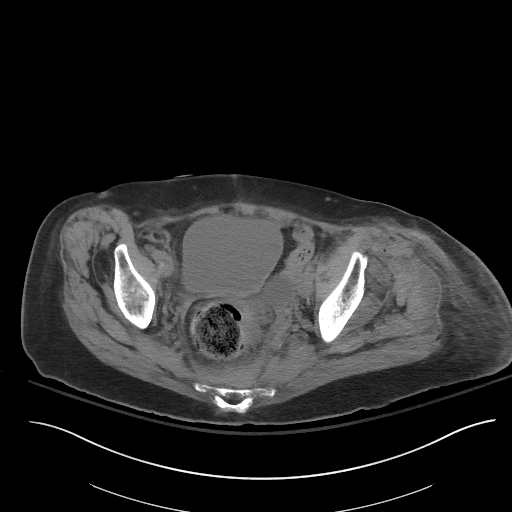
[im 38/110  soft-tissue]
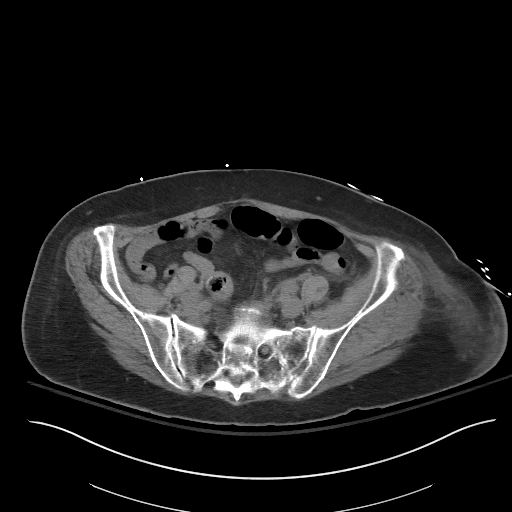
[im 48/110  soft-tissue]
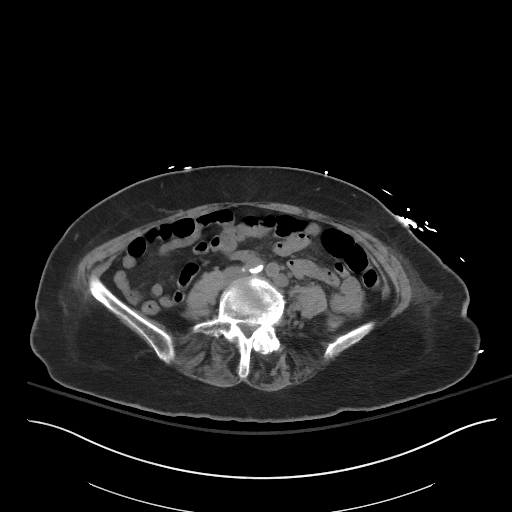
[im 57/110  soft-tissue]
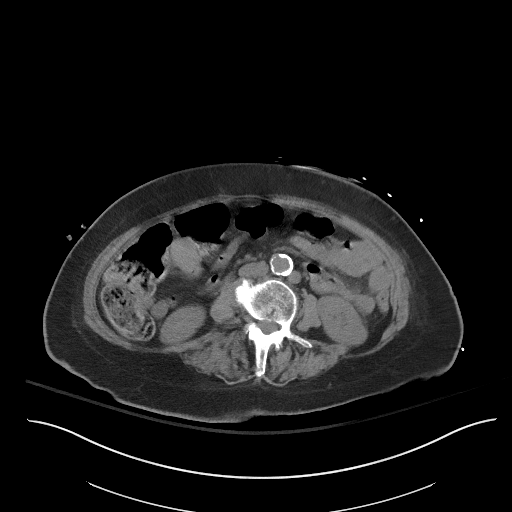
[im 67/110  soft-tissue]
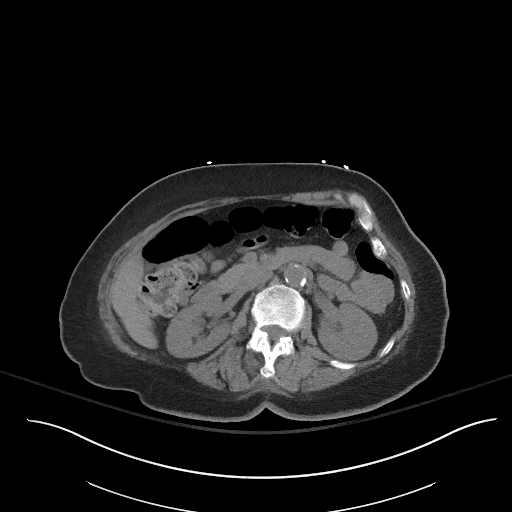
[im 76/110  soft-tissue]
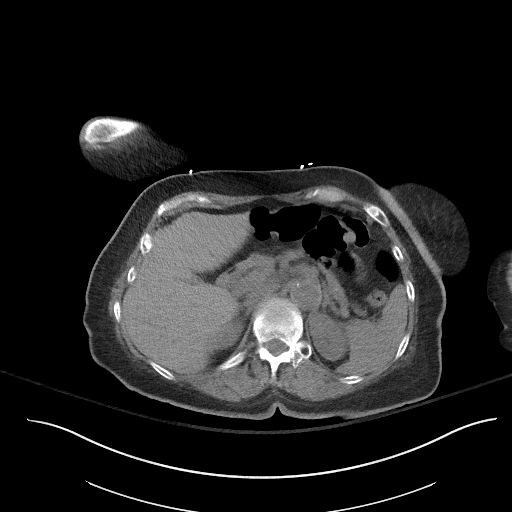
[im 86/110  soft-tissue]
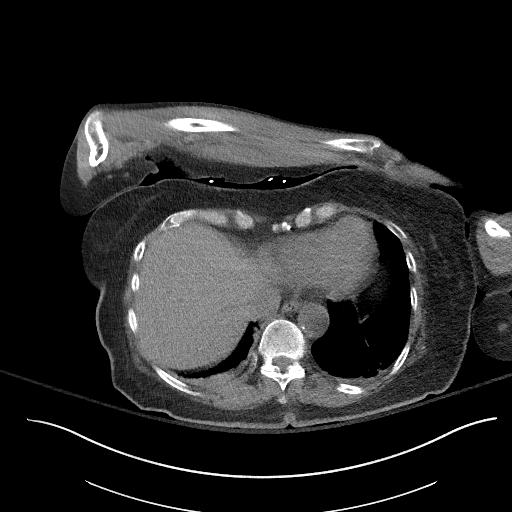
[im 86/110  bone]
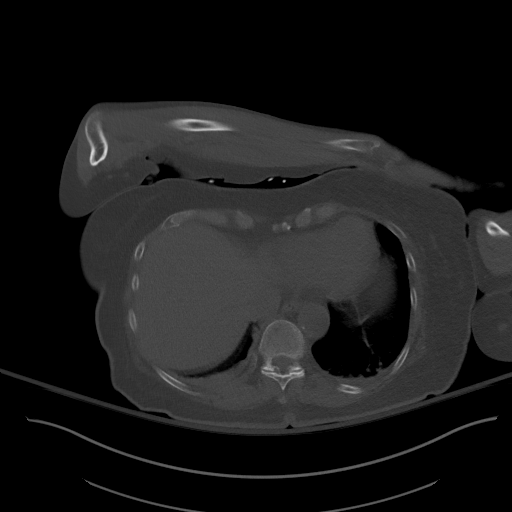
[im 95/110  soft-tissue]
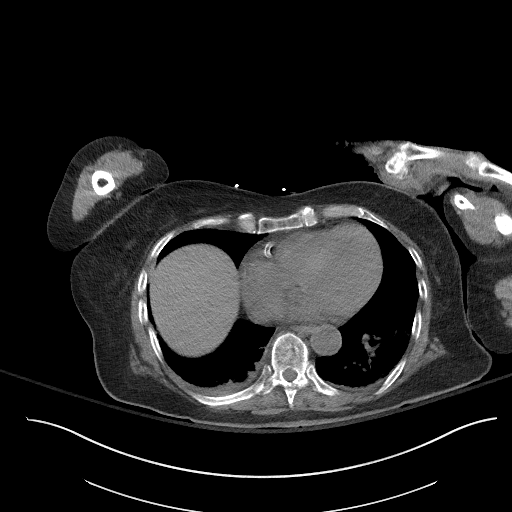
[im 105/110  soft-tissue]
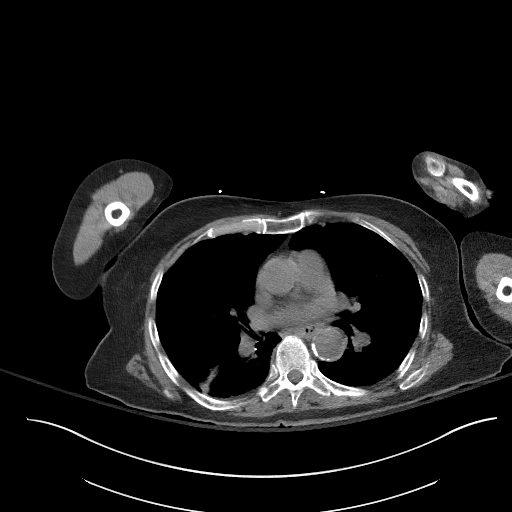

[Series 5: coronal st · coronal · 0.74mm/px · 3 of 97 slices shown]
[im 33/97  soft-tissue]
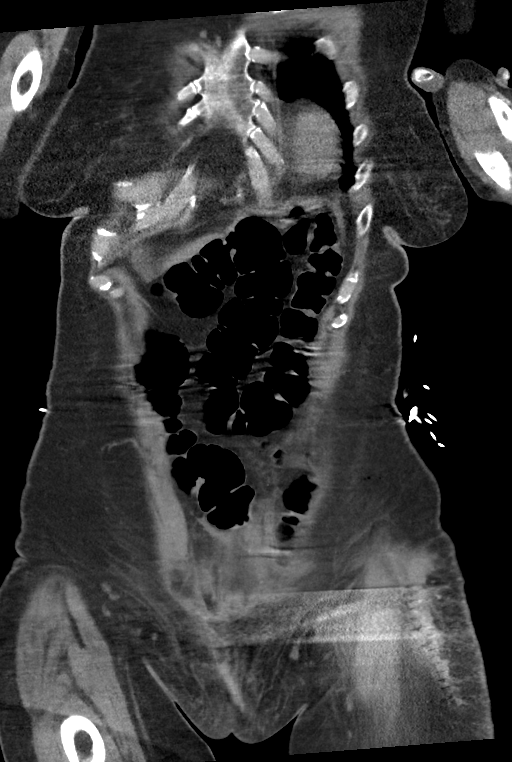
[im 43/97  soft-tissue]
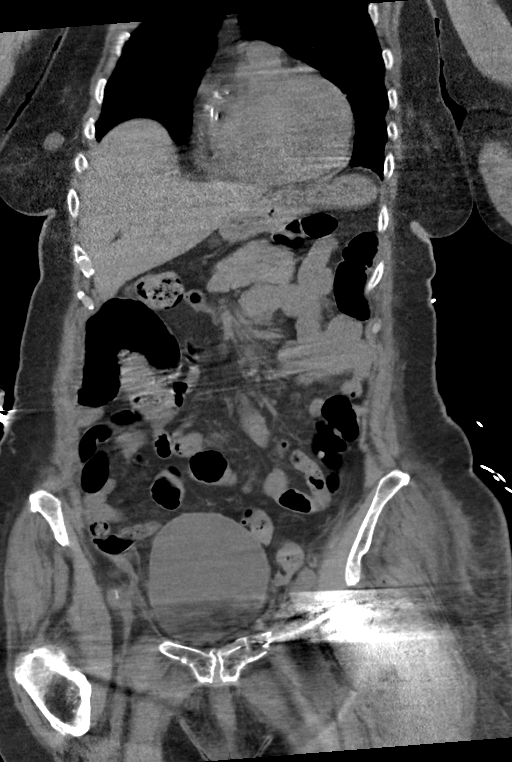
[im 54/97  soft-tissue]
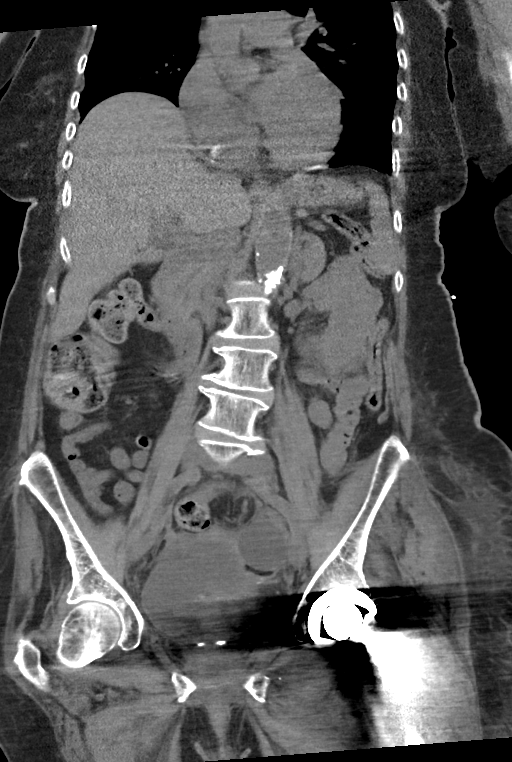

[14 of 46 positions shown; findings below may reference images not displayed]

FINDINGS: Lower chest: Right greater than left base atelectasis or scarring.
Trace right pleural fluid. Mild cardiomegaly with right coronary
artery calcification. Inferior right breast nodule of 1.7 cm on
[DATE].

Hepatobiliary: Suspect a central right hepatic lobe 11 mm cyst on
[DATE]. Normal gallbladder, without biliary ductal dilatation.

Pancreas: Normal, without mass or ductal dilatation.

Spleen: Normal in size, without focal abnormality.

Adrenals/Urinary Tract: Normal adrenal glands. No renal calculi or
hydronephrosis. No abdominal ureteric stones. Degraded evaluation of
the pelvis, secondary to beam hardening artifact from left hip
arthroplasty. No gross bladder calculi.

Stomach/Bowel: Normal stomach, without wall thickening. 7.3 cm stool
ball within the rectum. Pelvic edema which is favored to be centered
about the rectum, including on 84/2. Concurrent presacral soft
tissue thickening on this image. Normal terminal ileum. Normal small
bowel.

Vascular/Lymphatic: Aortic and branch vessel atherosclerosis.
Duplicated IVC. No abdominopelvic adenopathy.

Reproductive: Hysterectomy. Left ovarian/adnexal 3.1 cm fluid
density lesion on 80/2.

Other: No free intraperitoneal air.  No abdominal ascites.

Musculoskeletal: Subcutaneous edema about the left hip and vulva,
likely postoperative. Minimal ill-defined complex fluid anteriorly
including on 98/2 is likely due to blood products. Trace gas in this
area just deep to surgical staples. Overlying mild intramuscular
fluid and probable hemorrhage including on 108/2.

Left hip arthroplasty. Left sacral minimally displaced fractures
including on 73/2. Left pubic rami fractures. Lumbosacral
spondylosis. Mild convex left lumbar spine curvature.
IMPRESSION: 1. Large stool ball in the rectum with surrounding edema, suspicious
for stercoral proctitis.
2. No other explanation for abdominal pain.
3. Trace right pleural fluid.
4. Left hip arthroplasty. Subcutaneous and intramuscular complex
fluid, likely hemorrhage.
5. Left pubic rami and sacral fractures.
6. Left ovarian 3.1 cm fluid density lesion. Recommend follow-up US
in 6-12 months. Note: This recommendation does not apply to
premenarchal patients and to those with increased risk (genetic,
family history, elevated tumor markers or other high-risk factors)
of ovarian cancer. Reference: JACR [DATE]):[id number]
7. Right breast nodule of 1.7 cm. Consider correlation with
mammogram and dedicated ultrasound.
8. Coronary artery atherosclerosis. Aortic Atherosclerosis
([id number]).

## 2024-06-23 DEATH — deceased
